# Patient Record
Sex: Female | Born: 1975 | Race: Black or African American | Hispanic: No | Marital: Single | Smoking: Current every day smoker
Health system: Southern US, Community
[De-identification: ages and names within clinical notes are randomized; demographics above are authoritative.]

## PROBLEM LIST (undated history)

## (undated) DIAGNOSIS — F329 Major depressive disorder, single episode, unspecified: Secondary | ICD-10-CM

## (undated) DIAGNOSIS — F32A Depression, unspecified: Secondary | ICD-10-CM

## (undated) DIAGNOSIS — I10 Essential (primary) hypertension: Secondary | ICD-10-CM

## (undated) DIAGNOSIS — F419 Anxiety disorder, unspecified: Secondary | ICD-10-CM

## (undated) HISTORY — PX: NO PAST SURGERIES: SHX2092

---

## 2000-02-25 ENCOUNTER — Emergency Department (HOSPITAL_COMMUNITY): Admission: EM | Admit: 2000-02-25 | Discharge: 2000-02-25 | Payer: Self-pay | Admitting: Emergency Medicine

## 2003-10-09 ENCOUNTER — Emergency Department (HOSPITAL_COMMUNITY): Admission: EM | Admit: 2003-10-09 | Discharge: 2003-10-09 | Payer: Self-pay | Admitting: Emergency Medicine

## 2006-08-03 ENCOUNTER — Emergency Department (HOSPITAL_COMMUNITY): Admission: EM | Admit: 2006-08-03 | Discharge: 2006-08-03 | Payer: Self-pay | Admitting: *Deleted

## 2007-03-31 ENCOUNTER — Emergency Department (HOSPITAL_COMMUNITY): Admission: EM | Admit: 2007-03-31 | Discharge: 2007-03-31 | Payer: Self-pay | Admitting: Emergency Medicine

## 2008-05-13 ENCOUNTER — Emergency Department (HOSPITAL_COMMUNITY): Admission: EM | Admit: 2008-05-13 | Discharge: 2008-05-13 | Payer: Self-pay | Admitting: Emergency Medicine

## 2008-12-03 ENCOUNTER — Emergency Department (HOSPITAL_COMMUNITY): Admission: EM | Admit: 2008-12-03 | Discharge: 2008-12-03 | Payer: Self-pay | Admitting: Emergency Medicine

## 2009-03-13 ENCOUNTER — Emergency Department (HOSPITAL_COMMUNITY): Admission: EM | Admit: 2009-03-13 | Discharge: 2009-03-13 | Payer: Self-pay | Admitting: Emergency Medicine

## 2009-10-16 ENCOUNTER — Emergency Department (HOSPITAL_BASED_OUTPATIENT_CLINIC_OR_DEPARTMENT_OTHER): Admission: EM | Admit: 2009-10-16 | Discharge: 2009-10-16 | Payer: Self-pay | Admitting: Emergency Medicine

## 2010-04-25 LAB — RAPID STREP SCREEN (MED CTR MEBANE ONLY): Streptococcus, Group A Screen (Direct): NEGATIVE

## 2010-11-09 ENCOUNTER — Emergency Department (HOSPITAL_COMMUNITY)
Admission: EM | Admit: 2010-11-09 | Discharge: 2010-11-09 | Disposition: A | Discharge status: Home / Self Care | Payer: Private Health Insurance - Indemnity | Attending: Emergency Medicine | Admitting: Emergency Medicine

## 2010-11-09 ENCOUNTER — Emergency Department (HOSPITAL_COMMUNITY): Payer: Private Health Insurance - Indemnity

## 2010-11-09 DIAGNOSIS — R071 Chest pain on breathing: Secondary | ICD-10-CM | POA: Insufficient documentation

## 2010-11-09 DIAGNOSIS — Z79899 Other long term (current) drug therapy: Secondary | ICD-10-CM | POA: Insufficient documentation

## 2010-11-09 DIAGNOSIS — R61 Generalized hyperhidrosis: Secondary | ICD-10-CM | POA: Insufficient documentation

## 2010-11-09 DIAGNOSIS — F17200 Nicotine dependence, unspecified, uncomplicated: Secondary | ICD-10-CM | POA: Insufficient documentation

## 2010-11-09 DIAGNOSIS — I10 Essential (primary) hypertension: Secondary | ICD-10-CM | POA: Insufficient documentation

## 2010-11-09 DIAGNOSIS — R209 Unspecified disturbances of skin sensation: Secondary | ICD-10-CM | POA: Insufficient documentation

## 2010-11-09 LAB — POCT I-STAT, CHEM 8
HCT: 44 % (ref 36.0–46.0)
Hemoglobin: 15 g/dL (ref 12.0–15.0)
Potassium: 3.8 mEq/L (ref 3.5–5.1)
Sodium: 137 mEq/L (ref 135–145)
TCO2: 22 mmol/L (ref 0–100)

## 2010-11-09 LAB — POCT I-STAT TROPONIN I

## 2011-07-22 ENCOUNTER — Emergency Department (HOSPITAL_COMMUNITY)
Admission: EM | Admit: 2011-07-22 | Discharge: 2011-07-22 | Disposition: A | Discharge status: Home / Self Care | Payer: Private Health Insurance - Indemnity | Source: Home / Self Care

## 2011-07-22 ENCOUNTER — Encounter (HOSPITAL_COMMUNITY): Payer: Self-pay | Admitting: *Deleted

## 2011-07-22 DIAGNOSIS — M545 Low back pain: Secondary | ICD-10-CM

## 2011-07-22 HISTORY — DX: Essential (primary) hypertension: I10

## 2011-07-22 LAB — POCT URINALYSIS DIP (DEVICE)
Glucose, UA: NEGATIVE mg/dL
Nitrite: NEGATIVE
Protein, ur: NEGATIVE mg/dL
Urobilinogen, UA: 0.2 mg/dL (ref 0.0–1.0)

## 2011-07-22 LAB — POCT PREGNANCY, URINE: Preg Test, Ur: NEGATIVE

## 2011-07-22 MED ORDER — TRAMADOL HCL 50 MG PO TABS: 50.0000 mg | ORAL_TABLET | Freq: Four times a day (QID) | ORAL | Status: AC | PRN

## 2011-07-22 MED ORDER — CYCLOBENZAPRINE HCL 10 MG PO TABS: 10.0000 mg | ORAL_TABLET | Freq: Three times a day (TID) | ORAL | Status: AC | PRN

## 2011-07-22 MED ORDER — MELOXICAM 15 MG PO TABS: 15.0000 mg | ORAL_TABLET | Freq: Every day | ORAL | Status: DC

## 2011-07-22 NOTE — ED Provider Notes (Signed)
Grace Nolan is a 36 y.o. female who presents to Urgent Care today for low back pain. Patient was at work yesterday when she noted sudden onset of mild low back pain. She denies any injury or change in usual level of activity preceding the pain.  The pain worsened in the evening and she took Tylenol which did not help much. This morning the pain has worsened again. The pain does not radiate and stays in the low back. She denies any weakness numbness bowel or bladder dysfunction or trouble walking.  Standing up makes the pain worse.     PMH reviewed. Significant for hypertension History  Substance Use Topics  . Smoking status: Current Everyday Smoker  . Smokeless tobacco: Not on file  . Alcohol Use: Yes   ROS as above Medications reviewed. No current facility-administered medications for this encounter.   Current Outpatient Prescriptions  Medication Sig Dispense Refill  . ALPRAZolam (XANAX) 0.25 MG tablet Take 0.25 mg by mouth at bedtime as needed.      Marland Kitchen lisinopril (PRINIVIL,ZESTRIL) 20 MG tablet Take 20 mg by mouth daily.      . cyclobenzaprine (FLEXERIL) 10 MG tablet Take 1 tablet (10 mg total) by mouth 3 (three) times daily as needed for muscle spasms.  30 tablet  0  . meloxicam (MOBIC) 15 MG tablet Take 1 tablet (15 mg total) by mouth daily.  14 tablet  0  . traMADol (ULTRAM) 50 MG tablet Take 1 tablet (50 mg total) by mouth every 6 (six) hours as needed for pain.  10 tablet  0    Exam:  BP 115/76  Pulse 80  Temp(Src) 99 F (37.2 C) (Oral)  Resp 20  SpO2 98%  LMP 07/20/2011 Gen: Well NAD HEENT: EOMI,  MMM Lungs: CTABL Nl WOB Heart: RRR no MRG Abd: NABS, NT, ND, no costovertebral angle tenderness Exts: Non edematous BL  LE, warm and well perfused.  MSK: Nontender over spinal midline. Tender to palpation bilateral SI joints. Negative straight leg raise test bilaterally.  Reflexes are diminished but equal bilaterally. Strength and sensation are intact. Patient HEENT get  onto and off of the exam table by herself.  Results for orders placed during the hospital encounter of 07/22/11 (from the past 24 hour(s))  POCT URINALYSIS DIP (DEVICE)     Status: Abnormal   Collection Time   07/22/11  5:15 PM      Component Value Range   Glucose, UA NEGATIVE  NEGATIVE (mg/dL)   Bilirubin Urine NEGATIVE  NEGATIVE    Ketones, ur NEGATIVE  NEGATIVE (mg/dL)   Specific Gravity, Urine 1.015  1.005 - 1.030    Hgb urine dipstick MODERATE (*) NEGATIVE    pH 6.0  5.0 - 8.0    Protein, ur NEGATIVE  NEGATIVE (mg/dL)   Urobilinogen, UA 0.2  0.0 - 1.0 (mg/dL)   Nitrite NEGATIVE  NEGATIVE    Leukocytes, UA TRACE (*) NEGATIVE   POCT PREGNANCY, URINE     Status: Normal   Collection Time   07/22/11  5:19 PM      Component Value Range   Preg Test, Ur NEGATIVE  NEGATIVE    No results found.  Assessment and Plan: 36 y.o. female with lumbar back pain.  The pain started at work without apparent injury.  This is likely a muscle or tendon pull.  She has no red flag signs or symptom. Plan to treat symptomatically with meloxicam, Flexeril, and 10 tablets of tramadol.  Provided a work  note, and advised consideration of workers compensation claim.  Recommended followup with primary care doctor. Discussed warning signs or symptoms. Please see discharge instructions. Patient expresses understanding.      Rodolph Bong, MD 07/22/11 [phone]

## 2011-07-22 NOTE — Discharge Instructions (Signed)
Thank you for coming in today. Please take the meloxicam for up to 2 weeks as needed.  Use the flexeril at night for pain.  Tramadol and Flexeril will make you sleepy. Don't drive after taking those meds.  Come back or go to the emergency room if you notice new weakness new numbness problems walking or bowel or bladder problems. Follow up with your doctor if you do not get better.   Back Pain, Adult Low back pain is very common. About 1 in 5 people have back pain.The cause of low back pain is rarely dangerous. The pain often gets better over time.About half of people with a sudden onset of back pain feel better in just 2 weeks. About 8 in 10 people feel better by 6 weeks.  CAUSES Some common causes of back pain include:  Strain of the muscles or ligaments supporting the spine.   Wear and tear (degeneration) of the spinal discs.   Arthritis.   Direct injury to the back.  DIAGNOSIS Most of the time, the direct cause of low back pain is not known.However, back pain can be treated effectively even when the exact cause of the pain is unknown.Answering your caregiver's questions about your overall health and symptoms is one of the most accurate ways to make sure the cause of your pain is not dangerous. If your caregiver needs more information, he or she may order lab work or imaging tests (X-rays or MRIs).However, even if imaging tests show changes in your back, this usually does not require surgery. HOME CARE INSTRUCTIONS For many people, back pain returns.Since low back pain is rarely dangerous, it is often a condition that people can learn to [facility] their own.   Remain active. It is stressful on the back to sit or stand in one place. Do not sit, drive, or stand in one place for more than 30 minutes at a time. Take short walks on level surfaces as soon as pain allows.Try to increase the length of time you walk each day.   Do not stay in bed.Resting more than 1 or 2 days can delay  your recovery.   Do not avoid exercise or work.Your body is made to move.It is not dangerous to be active, even though your back may hurt.Your back will likely heal faster if you return to being active before your pain is gone.   Pay attention to your body when you bend and lift. Many people have less discomfortwhen lifting if they bend their knees, keep the load close to their bodies,and avoid twisting. Often, the most comfortable positions are those that put less stress on your recovering back.   Find a comfortable position to sleep. Use a firm mattress and lie on your side with your knees slightly bent. If you lie on your back, put a pillow under your knees.   Only take over-the-counter or prescription medicines as directed by your caregiver. Over-the-counter medicines to reduce pain and inflammation are often the most helpful.Your caregiver may prescribe muscle relaxant drugs.These medicines help dull your pain so you can more quickly return to your normal activities and healthy exercise.   Put ice on the injured area.   Put ice in a plastic bag.   Place a towel between your skin and the bag.   Leave the ice on for 15 to 20 minutes, 3 to 4 times a day for the first 2 to 3 days. After that, ice and heat may be alternated to reduce pain and  spasms.   Ask your caregiver about trying back exercises and gentle massage. This may be of some benefit.   Avoid feeling anxious or stressed.Stress increases muscle tension and can worsen back pain.It is important to recognize when you are anxious or stressed and learn ways to manage it.Exercise is a great option.  SEEK MEDICAL CARE IF:  You have pain that is not relieved with rest or medicine.   You have pain that does not improve in 1 week.   You have new symptoms.   You are generally not feeling well.  SEEK IMMEDIATE MEDICAL CARE IF:   You have pain that radiates from your back into your legs.   You develop new bowel or bladder  control problems.   You have unusual weakness or numbness in your arms or legs.   You develop nausea or vomiting.   You develop abdominal pain.   You feel faint.  Document Released: 01/27/2005 Document Revised: 01/16/2011 Document Reviewed: 06/17/2010 [facility] Patient Information 2012 [place], [place].

## 2011-07-22 NOTE — ED Provider Notes (Signed)
Medical screening examination/treatment/procedure(s) were performed by a resident physician and as supervising physician I was immediately available for consultation/collaboration.  Leslee Home, M.D.   Reuben Likes, MD 07/22/11 2129

## 2011-07-22 NOTE — ED Notes (Signed)
pT  REPORTS  PAIN  IN  THE  MIDDLE  OF  HER  LOWER  BACK   WHICH  SHE  REPORTS     BEGAN  YEST  PT  ALOS  REPORTS  SOME  PRESSURE  WHEN  SHE  URINATES    AS  WELL  AS  FREQUENCY  -  SHE  AMBULATES  WITH A  SLOW  STEADY  GAIT  SOMEWENT  BENT  OVER

## 2011-12-03 ENCOUNTER — Ambulatory Visit (HOSPITAL_COMMUNITY)
Admission: RE | Admit: 2011-12-03 | Payer: Managed Care, Other (non HMO) | Source: Home / Self Care | Admitting: Psychiatry

## 2011-12-04 ENCOUNTER — Emergency Department (HOSPITAL_COMMUNITY)
Admission: EM | Admit: 2011-12-04 | Discharge: 2011-12-05 | Disposition: A | Discharge status: Home / Self Care | Payer: Managed Care, Other (non HMO) | Attending: Emergency Medicine | Admitting: Emergency Medicine

## 2011-12-04 ENCOUNTER — Encounter (HOSPITAL_COMMUNITY): Payer: Self-pay

## 2011-12-04 DIAGNOSIS — I10 Essential (primary) hypertension: Secondary | ICD-10-CM | POA: Insufficient documentation

## 2011-12-04 DIAGNOSIS — F3289 Other specified depressive episodes: Secondary | ICD-10-CM | POA: Insufficient documentation

## 2011-12-04 DIAGNOSIS — Z87891 Personal history of nicotine dependence: Secondary | ICD-10-CM | POA: Insufficient documentation

## 2011-12-04 DIAGNOSIS — F411 Generalized anxiety disorder: Secondary | ICD-10-CM | POA: Insufficient documentation

## 2011-12-04 DIAGNOSIS — F1210 Cannabis abuse, uncomplicated: Secondary | ICD-10-CM | POA: Insufficient documentation

## 2011-12-04 DIAGNOSIS — F1410 Cocaine abuse, uncomplicated: Secondary | ICD-10-CM | POA: Insufficient documentation

## 2011-12-04 DIAGNOSIS — F1910 Other psychoactive substance abuse, uncomplicated: Secondary | ICD-10-CM

## 2011-12-04 DIAGNOSIS — F329 Major depressive disorder, single episode, unspecified: Secondary | ICD-10-CM

## 2011-12-04 HISTORY — DX: Major depressive disorder, single episode, unspecified: F32.9

## 2011-12-04 HISTORY — DX: Depression, unspecified: F32.A

## 2011-12-04 LAB — CBC WITH DIFFERENTIAL/PLATELET
Basophils Absolute: 0 10*3/uL (ref 0.0–0.1)
Basophils Relative: 0 % (ref 0–1)
Eosinophils Relative: 0 % (ref 0–5)
Lymphocytes Relative: 35 % (ref 12–46)
MCHC: 33.9 g/dL (ref 30.0–36.0)
MCV: 94.6 fL (ref 78.0–100.0)
Monocytes Absolute: 1 10*3/uL (ref 0.1–1.0)
Platelets: 276 10*3/uL (ref 150–400)
RDW: 14.2 % (ref 11.5–15.5)
WBC: 14.2 10*3/uL — ABNORMAL HIGH (ref 4.0–10.5)

## 2011-12-04 LAB — RAPID URINE DRUG SCREEN, HOSP PERFORMED
Amphetamines: NOT DETECTED
Barbiturates: NOT DETECTED
Benzodiazepines: NOT DETECTED
Cocaine: NOT DETECTED
Tetrahydrocannabinol: NOT DETECTED

## 2011-12-04 LAB — COMPREHENSIVE METABOLIC PANEL
ALT: 20 U/L (ref 0–35)
AST: 23 U/L (ref 0–37)
Albumin: 3.8 g/dL (ref 3.5–5.2)
CO2: 22 mEq/L (ref 19–32)
Calcium: 9.5 mg/dL (ref 8.4–10.5)
Creatinine, Ser: 0.84 mg/dL (ref 0.50–1.10)
GFR calc non Af Amer: 88 mL/min — ABNORMAL LOW (ref >90)
Sodium: 133 mEq/L — ABNORMAL LOW (ref 135–145)
Total Protein: 8.1 g/dL (ref 6.0–8.3)

## 2011-12-04 LAB — ETHANOL: Alcohol, Ethyl (B): 159 mg/dL — ABNORMAL HIGH (ref 0–11)

## 2011-12-04 MED ORDER — LISINOPRIL-HYDROCHLOROTHIAZIDE 20-12.5 MG PO TABS: 1.0000 | ORAL_TABLET | Freq: Every day | ORAL | Status: DC

## 2011-12-04 MED ORDER — ONDANSETRON HCL 4 MG PO TABS: 4.0000 mg | ORAL_TABLET | Freq: Three times a day (TID) | ORAL | Status: DC | PRN

## 2011-12-04 MED ORDER — LORAZEPAM 1 MG PO TABS
0.0000 mg | ORAL_TABLET | Freq: Four times a day (QID) | ORAL | Status: DC
  Administered 2011-12-04: 1 mg via ORAL
  Filled 2011-12-04: qty 1

## 2011-12-04 MED ORDER — ADULT MULTIVITAMIN W/MINERALS CH
1.0000 | ORAL_TABLET | Freq: Every day | ORAL | Status: DC
  Administered 2011-12-04: 1 via ORAL
  Filled 2011-12-04: qty 1

## 2011-12-04 MED ORDER — THIAMINE HCL 100 MG/ML IJ SOLN: 100.0000 mg | Freq: Every day | INTRAMUSCULAR | Status: DC

## 2011-12-04 MED ORDER — FOLIC ACID 1 MG PO TABS
1.0000 mg | ORAL_TABLET | Freq: Every day | ORAL | Status: DC
  Administered 2011-12-04: 1 mg via ORAL
  Filled 2011-12-04: qty 1

## 2011-12-04 MED ORDER — IBUPROFEN 200 MG PO TABS: 600.0000 mg | ORAL_TABLET | Freq: Three times a day (TID) | ORAL | Status: DC | PRN

## 2011-12-04 MED ORDER — LISINOPRIL 20 MG PO TABS: 20.0000 mg | ORAL_TABLET | Freq: Every day | ORAL | Status: DC

## 2011-12-04 MED ORDER — VITAMIN B-1 100 MG PO TABS
100.0000 mg | ORAL_TABLET | Freq: Every day | ORAL | Status: DC
  Administered 2011-12-04: 100 mg via ORAL
  Filled 2011-12-04: qty 1

## 2011-12-04 MED ORDER — LORAZEPAM 1 MG PO TABS: 0.0000 mg | ORAL_TABLET | Freq: Two times a day (BID) | ORAL | Status: DC

## 2011-12-04 MED ORDER — ALUM & MAG HYDROXIDE-SIMETH 200-200-20 MG/5ML PO SUSP: 30.0000 mL | ORAL | Status: DC | PRN

## 2011-12-04 MED ORDER — ACETAMINOPHEN 325 MG PO TABS: 650.0000 mg | ORAL_TABLET | ORAL | Status: DC | PRN

## 2011-12-04 MED ORDER — LORAZEPAM 1 MG PO TABS: 1.0000 mg | ORAL_TABLET | Freq: Three times a day (TID) | ORAL | Status: DC | PRN

## 2011-12-04 MED ORDER — HYDROCHLOROTHIAZIDE 12.5 MG PO CAPS: 12.5000 mg | ORAL_CAPSULE | Freq: Every day | ORAL | Status: DC

## 2011-12-04 MED ORDER — ZOLPIDEM TARTRATE 5 MG PO TABS: 5.0000 mg | ORAL_TABLET | Freq: Every evening | ORAL | Status: DC | PRN

## 2011-12-04 NOTE — ED Provider Notes (Signed)
History     CSN: [id number]  Arrival date & time 12/04/11  1903   First MD Initiated Contact with Patient 12/04/11 1912      Chief Complaint  Patient presents with  . Medical Clearance    (Consider location/radiation/quality/duration/timing/severity/associated sxs/prior treatment) The history is provided by the patient.   patient presents with worsening depression. She states she's been depressed for a while, but it stopped working due to it recently. She states that she needs help. She states she feels as if she's not doing anything up to her potential. No active suicidal thoughts but has thought about not being around. She states she's been drinking more heavily. She drinks about 12 pack of beer a couple shots a day. She states she's had more time to drinks and she has not been working. She will occasionally use marijuana or cocaine, but has not used cocaine recently.   Past Medical History  Diagnosis Date  . Hypertension     History reviewed. No pertinent past surgical history.  History reviewed. No pertinent family history.  History  Substance Use Topics  . Smoking status: Current Every Day Smoker  . Smokeless tobacco: Never Used  . Alcohol Use: Yes     Patient states she drinks daily- 12 pack beer and a couple shots of liquor    OB History    Grav Para Term Preterm Abortions TAB SAB Ect Mult Living                  Review of Systems  Constitutional: Negative for activity change and appetite change.  HENT: Negative for neck stiffness.   Eyes: Negative for pain.  Respiratory: Negative for chest tightness and shortness of breath.   Cardiovascular: Negative for chest pain and leg swelling.  Gastrointestinal: Negative for nausea, vomiting, abdominal pain and diarrhea.  Genitourinary: Negative for flank pain.  Musculoskeletal: Negative for back pain.  Skin: Negative for rash.  Neurological: Negative for weakness, numbness and headaches.  Psychiatric/Behavioral:  Positive for dysphoric mood. Negative for behavioral problems.    Allergies  Review of patient's allergies indicates no known allergies.  Home Medications   Current Outpatient Rx  Name Route Sig Dispense Refill  . ALPRAZOLAM 0.25 MG PO TABS Oral Take 0.25 mg by mouth at bedtime as needed. Anxiety    . LISINOPRIL-HYDROCHLOROTHIAZIDE 20-12.5 MG PO TABS Oral Take 1 tablet by mouth daily.      BP 117/73  Pulse 79  Temp 98.1 F (36.7 C) (Oral)  Resp 18  SpO2 99%  LMP 11/23/2011  Physical Exam  Nursing note and vitals reviewed. Constitutional: She is oriented to person, place, and time. She appears well-developed and well-nourished.  HENT:  Head: Normocephalic and atraumatic.  Eyes: EOM are normal. Pupils are equal, round, and reactive to light.  Neck: Normal range of motion. Neck supple.  Cardiovascular: Normal rate, regular rhythm and normal heart sounds.   No murmur heard. Pulmonary/Chest: Effort normal and breath sounds normal. No respiratory distress. She has no wheezes. She has no rales.  Abdominal: Soft. Bowel sounds are normal. She exhibits no distension. There is no tenderness. There is no rebound and no guarding.  Musculoskeletal: Normal range of motion.  Neurological: She is alert and oriented to person, place, and time. No cranial nerve deficit.  Skin: Skin is warm and dry.  Psychiatric: Her speech is normal.       Patient appears depressed and somewhat tearful.    ED Course  Procedures (including critical  care time)  Labs Reviewed  CBC WITH DIFFERENTIAL - Abnormal; Notable for the following:    WBC 14.2 (*)     Neutro Abs 8.2 (*)     Lymphs Abs 5.0 (*)     All other components within normal limits  COMPREHENSIVE METABOLIC PANEL - Abnormal; Notable for the following:    Sodium 133 (*)     Potassium 3.4 (*)     Glucose, Bld 117 (*)     Total Bilirubin 0.2 (*)     GFR calc non Af Amer 88 (*)     All other components within normal limits  ETHANOL - Abnormal;  Notable for the following:    Alcohol, Ethyl (B) 159 (*)     All other components within normal limits  URINE RAPID DRUG SCREEN (HOSP PERFORMED)  PREGNANCY, URINE   No results found.   1. Substance abuse   2. Depression       MDM  Patient with depression and somewhat alcohol abuse. Lab work is overall reassuring. Patient is then seen by ACT team and will be discharged. She'll be given some benzodiazepine to followup as needed. She does not appear to be actively suicidal.        Juliet Rude. Rubin Payor, MD 12/04/11 2332

## 2011-12-04 NOTE — ED Notes (Signed)
Patient reports that she has been out of work  Since 10/31/11 and has been feeling depressed. Patient states she has had a couple shots of liquor today. Patient states she is seeking help for depression. Patient denies SI/HI

## 2011-12-05 ENCOUNTER — Encounter (HOSPITAL_COMMUNITY): Payer: Self-pay | Admitting: *Deleted

## 2011-12-05 NOTE — BH Assessment (Signed)
Assessment Note   Grace Nolan is a 36 y.o. female who presents to [facility] with increased depression and anxiety.  Pt denies SI/HI/Psych, no intent to harm self.  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  Pt says she's stressed from work(on leave from work since 10/23/11), recent legal trouble involving son, financial issues and lost apartment(currently living with grandparents).  Pt admits to drinking 12 pk of beer 6 out of 7 days or 1/2 pint of liquor.  Pt has no inpt/outpt mental health hx.  Pt contracted for safety and recv'd outpt referrals, dr. Rubin Payor agreed with disposition and d/c pt home.    Axis I: Depressive Disorder NOS Axis II: Deferred Axis III:  Past Medical History  Diagnosis Date  . Hypertension   . Depression    Axis IV: economic problems, housing problems, other psychosocial or environmental problems, problems related to social environment and problems with access to health care services Axis V: 51-60 moderate symptoms  Past Medical History:  Past Medical History  Diagnosis Date  . Hypertension   . Depression     History reviewed. No pertinent past surgical history.  Family History: History reviewed. No pertinent family history.  Social History:  reports that she has been smoking.  She has never used smokeless tobacco. She reports that she drinks alcohol. She reports that she uses illicit drugs (Marijuana and Cocaine).  Additional Social History:  Alcohol / Drug Use Pain Medications: None  Prescriptions: None  Over the Counter: None  History of alcohol / drug use?: Yes Longest period of sobriety (when/how long): None   CIWA: CIWA-Ar BP: 117/73 mmHg Pulse Rate: 79  Nausea and Vomiting: no nausea and no vomiting Tactile Disturbances: none Tremor: no tremor Auditory Disturbances: not present Paroxysmal  Sweats: no sweat visible Visual Disturbances: not present Anxiety: mildly anxious Headache, Fullness in Head: very mild Agitation: normal activity Orientation and Clouding of Sensorium: oriented and can do serial additions CIWA-Ar Total: 2  COWS:    Allergies: No Known Allergies  Home Medications:  (Not in a hospital admission)  OB/GYN Status:  Patient's last menstrual period was 11/23/2011.  General Assessment Data Location of Assessment: [facility] Living Arrangements: Other relatives (Lives with grandparents ) Can pt return to current living arrangement?: Yes Admission Status: Voluntary Is patient capable of signing voluntary admission?: Yes Transfer from: Acute Hospital Referral Source: MD  Education Status Is patient currently in school?: No Current Grade: None  Highest grade of school patient has completed: None  Name of school: None  Contact person: None   Risk to self Suicidal Ideation: No Suicidal Intent: No Is patient at risk for suicide?: No Suicidal Plan?: No Access to Means: No What has been your use of drugs/alcohol within the last 12 months?: Abusing alcohol for approx 1 month  Previous Attempts/Gestures: No How many times?: 0  Other Self Harm Risks: None Triggers for Past Attempts: None known Intentional Self Injurious Behavior: None Family Suicide History: No Recent stressful life event(s): Financial Problems;Other (Comment) (Increased depression ) Persecutory voices/beliefs?: No Depression: Yes Depression Symptoms: Feeling worthless/self pity;Loss of interest in usual pleasures;Tearfulness;Insomnia;Despondent;Isolating Substance abuse history and/or treatment for substance abuse?: No Suicide prevention information given to non-admitted patients: Not applicable  Risk to Others Homicidal Ideation: No Thoughts of Harm to Others: No Current Homicidal Intent: No Current Homicidal Plan: No Access to Homicidal Means: No Identified Victim:  None  History  of harm to others?: No Assessment of Violence: None Noted Violent Behavior Description: None  Does patient have access to weapons?: No Criminal Charges Pending?: No Describe Pending Criminal Charges: None  Does patient have a court date: No  Psychosis Hallucinations: None noted Delusions: None noted  Mental Status Report Appear/Hygiene: Other (Comment) (Appropriate ) Eye Contact: Good Motor Activity: Unremarkable Speech: Logical/coherent Level of Consciousness: Alert Mood: Depressed;Sad Affect: Depressed;Sad Anxiety Level: None Thought Processes: Coherent;Relevant Judgement: Unimpaired Orientation: Person;Place;Time;Situation Obsessive Compulsive Thoughts/Behaviors: None  Cognitive Functioning Concentration: Decreased Memory: Recent Intact;Remote Intact IQ: Average Insight: Good Impulse Control: Good Appetite: Fair Weight Loss: 0  Weight Gain: 0  Sleep: Decreased Total Hours of Sleep: 2  Vegetative Symptoms: None  ADLScreening [facility]) Patient's cognitive ability adequate to safely complete daily activities?: Yes Patient able to express need for assistance with ADLs?: Yes Independently performs ADLs?: Yes (appropriate for developmental age)  Abuse/Neglect [facility]) Physical Abuse: Denies Verbal Abuse: Denies Sexual Abuse: Denies  Prior Inpatient Therapy Prior Inpatient Therapy: No Prior Therapy Dates: None  Prior Therapy Facilty/Provider(s): None  Reason for Treatment: None   Prior Outpatient Therapy Prior Outpatient Therapy: No Prior Therapy Dates: None  Prior Therapy Facilty/Provider(s): None  Reason for Treatment: None   ADL Screening (condition at time of admission) Patient's cognitive ability adequate to safely complete daily activities?: Yes Patient able to express need for assistance with ADLs?: Yes Independently performs ADLs?: Yes (appropriate for developmental age) Weakness of Legs: None Weakness of Arms/Hands: None  Home  Assistive Devices/Equipment Home Assistive Devices/Equipment: None  Therapy Consults (therapy consults require a physician order) PT Evaluation Needed: No OT Evalulation Needed: No SLP Evaluation Needed: No Abuse/Neglect Assessment (Assessment to be complete while patient is alone) Physical Abuse: Denies Verbal Abuse: Denies Sexual Abuse: Denies Exploitation of patient/patient's resources: Denies Self-Neglect: Denies Values / Beliefs Cultural Requests During Hospitalization: None Spiritual Requests During Hospitalization: None Consults Spiritual Care Consult Needed: No Social Work Consult Needed: No Merchant navy officer (For Healthcare) Advance Directive: Patient does not have advance directive;Patient would not like information Pre-existing out of facility DNR order (yellow form or pink MOST form): No Nutrition Screen- [facility] Patient's home diet: Regular Have you recently lost weight without trying?: No Have you been eating poorly because of a decreased appetite?: No Malnutrition Screening Tool Score: 0   Additional Information 1:1 In Past 12 Months?: No CIRT Risk: No Elopement Risk: No Does patient have medical clearance?: Yes     Disposition:  Disposition Disposition of Patient: Outpatient treatment;Referred to (Mental health and local community therapists/psych) Type of outpatient treatment: Adult  On Site Evaluation by:   Reviewed with Physician:     Murrell Redden 12/05/2011 12:55 AM

## 2012-02-13 ENCOUNTER — Encounter (HOSPITAL_COMMUNITY): Payer: Self-pay | Admitting: Emergency Medicine

## 2012-02-13 ENCOUNTER — Emergency Department (HOSPITAL_COMMUNITY)
Admission: EM | Admit: 2012-02-13 | Discharge: 2012-02-13 | Disposition: A | Discharge status: Home / Self Care | Payer: Managed Care, Other (non HMO) | Source: Home / Self Care | Attending: Emergency Medicine | Admitting: Emergency Medicine

## 2012-02-13 DIAGNOSIS — J069 Acute upper respiratory infection, unspecified: Secondary | ICD-10-CM

## 2012-02-13 LAB — POCT RAPID STREP A: Streptococcus, Group A Screen (Direct): NEGATIVE

## 2012-02-13 MED ORDER — PHENYLEPHRINE-CHLORPHEN-DM 10-4-12.5 MG/5ML PO LIQD: 5.0000 mL | ORAL | Status: DC | PRN

## 2012-02-13 NOTE — ED Provider Notes (Signed)
History     CSN: [id number]  Arrival date & time 02/13/12  1041   First MD Initiated Contact with Patient 02/13/12 1156      Chief Complaint  Patient presents with  . URI    (Consider location/radiation/quality/duration/timing/severity/associated sxs/prior treatment) HPI Comments: 37-year-old female presents with one week of cough, chest pain associated with cough, sore throat, runny nose and congested nose, bodyaches and malaise. She denies fever, chills or abdominal pain or GI/GU symptoms.   Past Medical History  Diagnosis Date  . Hypertension   . Depression     No past surgical history on file.  No family history on file.  History  Substance Use Topics  . Smoking status: Current Every Day Smoker  . Smokeless tobacco: Never Used  . Alcohol Use: Yes     Comment: Patient states she drinks daily- 12 pack beer and a couple shots of liquor    OB History    Grav Para Term Preterm Abortions TAB SAB Ect Mult Living                  Review of Systems  Constitutional: Negative for fever, chills, activity change, appetite change and fatigue.  HENT: Positive for congestion, rhinorrhea and postnasal drip. Negative for facial swelling, neck pain and neck stiffness.   Eyes: Negative.   Respiratory: Positive for cough. Negative for shortness of breath, wheezing and stridor.   Cardiovascular: Negative.   Gastrointestinal: Negative.   Skin: Negative for pallor and rash.  Neurological: Negative.     Allergies  Review of patient's allergies indicates no known allergies.  Home Medications   Current Outpatient Rx  Name  Route  Sig  Dispense  Refill  . ALPRAZOLAM 0.25 MG PO TABS   Oral   Take 0.25 mg by mouth at bedtime as needed. Anxiety         . LISINOPRIL-HYDROCHLOROTHIAZIDE 20-12.5 MG PO TABS   Oral   Take 1 tablet by mouth daily.         Marland Kitchen LORAZEPAM 1 MG PO TABS   Oral   Take 1 tablet (1 mg total) by mouth 3 (three) times daily as needed for anxiety.   15  tablet   0   . PHENYLEPHRINE-CHLORPHEN-DM 11-14-10.5 MG/5ML PO LIQD   Oral   Take 5 mLs by mouth every 4 (four) hours as needed.   120 mL   0     BP 154/104  Pulse 71  Temp 99.4 F (37.4 C) (Oral)  Resp 18  SpO2 97%  LMP 01/23/2012  Physical Exam  Nursing note and vitals reviewed. Constitutional: She is oriented to person, place, and time. She appears well-developed and well-nourished. No distress.  HENT:  Head: Normocephalic and atraumatic.  Mouth/Throat: No oropharyngeal exudate.       Bilateral TMs are normal Pharynx with minor erythema, no exudates and positive for clear PND  Eyes: EOM are normal. Pupils are equal, round, and reactive to light.  Neck: Normal range of motion. Neck supple.  Cardiovascular: Normal rate, regular rhythm and normal heart sounds.   Pulmonary/Chest: Effort normal and breath sounds normal. No respiratory distress. She has no wheezes. She has no rales.  Abdominal: Soft. There is no tenderness.  Musculoskeletal: Normal range of motion. She exhibits no edema.  Lymphadenopathy:    She has no cervical adenopathy.  Neurological: She is alert and oriented to person, place, and time. No cranial nerve deficit.  Skin: Skin is warm and dry. No rash  noted.  Psychiatric: She has a normal mood and affect.    ED Course  Procedures (including critical care time)   Labs Reviewed  POCT RAPID STREP A ([facility]RG CARE ONLY)   No results found.   1. URI (upper respiratory infection)       MDM  Patient is discharged in stable condition she has minor URI symptoms. He may go back to work on Monday. In the meantime she should drink plenty of fluids and stay well-hydrated Norell CS 1 teaspoon every 4 hours when necessary cough and congestion Wash hands frequently She has history of hypertension and is treated with Zestoretic. She has not had her medication today she advised to take when she gets home and daily. She we will need followup with her physician to  have her blood pressure rechecked x-rays down to normal.         Hayden Rasmussen, NP 02/13/12 1224  Hayden Rasmussen, NP 02/13/12 1225

## 2012-02-13 NOTE — ED Notes (Signed)
Pt c/o cold sx x1 week.  Sx include: cough w/green sputum, chest discomfort, runny nose, fevers, body aches, diarrhea, sore throat Denies: nauseas, vomiting Sx getting worse x1 day She is alert w/no signs of acute distress.

## 2012-02-17 NOTE — ED Provider Notes (Signed)
Medical screening examination/treatment/procedure(s) were performed by resident physician or non-physician practitioner and as supervising physician I was immediately available for consultation/collaboration.   Jane Birkel DOUGLAS MD.    Aneira Cavitt D Aileana Hodder, MD 02/17/12 1356 

## 2012-07-29 ENCOUNTER — Encounter (HOSPITAL_COMMUNITY): Payer: Self-pay | Admitting: Family Medicine

## 2012-07-29 ENCOUNTER — Emergency Department (HOSPITAL_COMMUNITY)
Admission: EM | Admit: 2012-07-29 | Discharge: 2012-07-30 | Disposition: A | Discharge status: Home / Self Care | Payer: Managed Care, Other (non HMO) | Attending: Emergency Medicine | Admitting: Emergency Medicine

## 2012-07-29 DIAGNOSIS — R52 Pain, unspecified: Secondary | ICD-10-CM | POA: Insufficient documentation

## 2012-07-29 DIAGNOSIS — F411 Generalized anxiety disorder: Secondary | ICD-10-CM | POA: Insufficient documentation

## 2012-07-29 DIAGNOSIS — F329 Major depressive disorder, single episode, unspecified: Secondary | ICD-10-CM | POA: Insufficient documentation

## 2012-07-29 DIAGNOSIS — R002 Palpitations: Secondary | ICD-10-CM | POA: Insufficient documentation

## 2012-07-29 DIAGNOSIS — F3289 Other specified depressive episodes: Secondary | ICD-10-CM | POA: Insufficient documentation

## 2012-07-29 DIAGNOSIS — R209 Unspecified disturbances of skin sensation: Secondary | ICD-10-CM | POA: Insufficient documentation

## 2012-07-29 DIAGNOSIS — I10 Essential (primary) hypertension: Secondary | ICD-10-CM | POA: Insufficient documentation

## 2012-07-29 DIAGNOSIS — R202 Paresthesia of skin: Secondary | ICD-10-CM

## 2012-07-29 DIAGNOSIS — Z79899 Other long term (current) drug therapy: Secondary | ICD-10-CM | POA: Insufficient documentation

## 2012-07-29 DIAGNOSIS — F17200 Nicotine dependence, unspecified, uncomplicated: Secondary | ICD-10-CM | POA: Insufficient documentation

## 2012-07-29 DIAGNOSIS — R51 Headache: Secondary | ICD-10-CM | POA: Insufficient documentation

## 2012-07-29 DIAGNOSIS — R7309 Other abnormal glucose: Secondary | ICD-10-CM | POA: Insufficient documentation

## 2012-07-29 DIAGNOSIS — R739 Hyperglycemia, unspecified: Secondary | ICD-10-CM

## 2012-07-29 HISTORY — DX: Anxiety disorder, unspecified: F41.9

## 2012-07-29 NOTE — ED Notes (Addendum)
Patient states she has had a headache, tingling in her feet and hands since 8pm. Also reports shortness of breath. Describes headache as pressure.

## 2012-07-30 LAB — POCT I-STAT, CHEM 8
BUN: 6 mg/dL (ref 6–23)
Chloride: 104 mEq/L (ref 96–112)
HCT: 44 % (ref 36.0–46.0)
Sodium: 137 mEq/L (ref 135–145)
TCO2: 21 mmol/L (ref 0–100)

## 2012-07-30 NOTE — ED Notes (Signed)
Patient states she began having a headache around 2000, described as tingling. Patient rating pain 6/10 on the top of her head and in her temples. Patient A&Ox4, NAD at this time. Patient watching TV.

## 2012-07-30 NOTE — ED Provider Notes (Signed)
History     CSN: [id number]  Arrival date & time 07/29/12  2345   First MD Initiated Contact with Patient 07/30/12 0015      Chief Complaint  Patient presents with  . Headache  . Tingling   HPI  History provided by the patient and family. Patient is a 37 year old female history of hypertension, anxiety and depression who presents with multiple complaints of heart palpitations, numbness and tingling to fingers and toes, head pressure and general bodyache.  Symptoms first began as she was lying down to go to sleep with increased heart rate and palpitations. Patient then reports feeling tingling and numbness in her bilateral fingertips and toes. She was feeling short of breath with rapid breathing. Because she was not feeling well she had her family bring her to the emergency room. On the way she began having a tightness and pressure in her head. This isn't described as mild and not as painful. She also complains of tightness through her body with slight ache. She did not use any treatments for her symptoms. She does take Xanax at times for anxiety. She did not use any of this tonight. She does report having some stress and worry before going to sleep. She was not tearful. Denies any other aggravating or alleviating factors. Denies any other associated symptoms.    Past Medical History  Diagnosis Date  . Hypertension   . Depression   . Anxiety     No past surgical history on file.  No family history on file.  History  Substance Use Topics  . Smoking status: Current Every Day Smoker -- 0.50 packs/day    Types: Cigarettes  . Smokeless tobacco: Never Used  . Alcohol Use: Yes     Comment: Patient states she drinks daily- 12 pack beer and a couple shots of liquor    OB History   Grav Para Term Preterm Abortions TAB SAB Ect Mult Living                  Review of Systems  Constitutional: Negative for fever, chills and diaphoresis.  Neurological: Negative for weakness and  numbness.  All other systems reviewed and are negative.    Allergies  Review of patient's allergies indicates no known allergies.  Home Medications   Current Outpatient Rx  Name  Route  Sig  Dispense  Refill  . ALPRAZolam (XANAX) 0.25 MG tablet   Oral   Take 0.25 mg by mouth at bedtime as needed. Anxiety         . citalopram (CELEXA) 10 MG tablet   Oral   Take 10 mg by mouth daily.         Marland Kitchen lisinopril-hydrochlorothiazide (PRINZIDE,ZESTORETIC) 20-12.5 MG per tablet   Oral   Take 1 tablet by mouth daily.           BP 152/93  Pulse 101  Temp(Src) 99.8 F (37.7 C) (Oral)  Resp 20  Ht 5\' 11"  (1.803 m)  Wt 240 lb (108.863 kg)  BMI 33.49 kg/m2  SpO2 100%  LMP 06/30/2012  Physical Exam  Nursing note and vitals reviewed. Constitutional: She is oriented to person, place, and time. She appears well-developed and well-nourished. No distress.  HENT:  Head: Normocephalic and atraumatic.  Mouth/Throat: Oropharynx is clear and moist.  Eyes: Conjunctivae and EOM are normal. Pupils are equal, round, and reactive to light.  Neck: Normal range of motion.  Cardiovascular: Normal rate and regular rhythm.   No murmur heard. Pulmonary/Chest:  Effort normal and breath sounds normal. No respiratory distress. She has no wheezes. She has no rales.  Abdominal: Soft. She exhibits no distension. There is no tenderness. There is no rebound and no guarding.  Musculoskeletal: Normal range of motion. She exhibits no edema and no tenderness.  Neurological: She is alert and oriented to person, place, and time. She has normal strength. No cranial nerve deficit or sensory deficit. Coordination and gait normal.  Strength is equal in all extremities. Normal sensation to light touch in bilateral hands and feet. Normal distal pulses.  Skin: Skin is warm and dry. No rash noted.  Psychiatric: She has a normal mood and affect. Her behavior is normal.    ED Course  Procedures   Results for orders  placed during the hospital encounter of 07/29/12  POCT I-STAT, CHEM 8      Result Value Range   Sodium 137  135 - 145 mEq/L   Potassium 3.8  3.5 - 5.1 mEq/L   Chloride 104  96 - 112 mEq/L   BUN 6  6 - 23 mg/dL   Creatinine, Ser 2.95  0.50 - 1.10 mg/dL   Glucose, Bld 284 (*) 70 - 99 mg/dL   Calcium, Ion 1.32 (*) 1.12 - 1.23 mmol/L   TCO2 21  0 - 100 mmol/L   Hemoglobin 15.0  12.0 - 15.0 g/dL   HCT 44.0  10.2 - 72.5 %        1. Headache   2. Paresthesia   3. Hyperglycemia       MDM  12:35 AM patient seen and evaluated. Patient seeing appears well in no acute distress. Normal respirations and O2 sats.  Patient continues to feel improved. No concerning findings on EKG. I-STAT does show slightly elevated blood sugar. No clear signs to indicate diabetes at this time. Will advise patient to followup with PCP for additional testing.    Date: 07/30/2012  Rate: 81  Rhythm: normal sinus rhythm  QRS Axis: normal  Intervals: normal  ST/T Wave abnormalities: nonspecific T wave changes  Conduction Disutrbances:none  Narrative Interpretation:   Old EKG Reviewed: unchanged from 11/09/2010       Angus Seller, PA-C 07/30/12 [phone]

## 2012-07-30 NOTE — ED Provider Notes (Signed)
Medical screening examination/treatment/procedure(s) were performed by non-physician practitioner and as supervising physician I was immediately available for consultation/collaboration.  Darleene Cumpian M Robby Bulkley, MD 07/30/12 0517 

## 2013-02-16 ENCOUNTER — Emergency Department (HOSPITAL_COMMUNITY): Payer: Managed Care, Other (non HMO)

## 2013-02-16 ENCOUNTER — Encounter (HOSPITAL_COMMUNITY): Payer: Self-pay | Admitting: Emergency Medicine

## 2013-02-16 ENCOUNTER — Emergency Department (HOSPITAL_COMMUNITY)
Admission: EM | Admit: 2013-02-16 | Discharge: 2013-02-16 | Disposition: A | Discharge status: Home / Self Care | Payer: Managed Care, Other (non HMO) | Attending: Emergency Medicine | Admitting: Emergency Medicine

## 2013-02-16 DIAGNOSIS — R197 Diarrhea, unspecified: Secondary | ICD-10-CM | POA: Insufficient documentation

## 2013-02-16 DIAGNOSIS — F3289 Other specified depressive episodes: Secondary | ICD-10-CM | POA: Insufficient documentation

## 2013-02-16 DIAGNOSIS — R000 Tachycardia, unspecified: Secondary | ICD-10-CM | POA: Insufficient documentation

## 2013-02-16 DIAGNOSIS — O98819 Other maternal infectious and parasitic diseases complicating pregnancy, unspecified trimester: Secondary | ICD-10-CM | POA: Insufficient documentation

## 2013-02-16 DIAGNOSIS — A599 Trichomoniasis, unspecified: Secondary | ICD-10-CM

## 2013-02-16 DIAGNOSIS — A5900 Urogenital trichomoniasis, unspecified: Secondary | ICD-10-CM | POA: Insufficient documentation

## 2013-02-16 DIAGNOSIS — F411 Generalized anxiety disorder: Secondary | ICD-10-CM | POA: Insufficient documentation

## 2013-02-16 DIAGNOSIS — F329 Major depressive disorder, single episode, unspecified: Secondary | ICD-10-CM | POA: Insufficient documentation

## 2013-02-16 DIAGNOSIS — O99330 Smoking (tobacco) complicating pregnancy, unspecified trimester: Secondary | ICD-10-CM | POA: Insufficient documentation

## 2013-02-16 DIAGNOSIS — Z79899 Other long term (current) drug therapy: Secondary | ICD-10-CM | POA: Insufficient documentation

## 2013-02-16 DIAGNOSIS — N390 Urinary tract infection, site not specified: Secondary | ICD-10-CM

## 2013-02-16 DIAGNOSIS — O200 Threatened abortion: Secondary | ICD-10-CM | POA: Insufficient documentation

## 2013-02-16 DIAGNOSIS — O169 Unspecified maternal hypertension, unspecified trimester: Secondary | ICD-10-CM | POA: Insufficient documentation

## 2013-02-16 DIAGNOSIS — O9989 Other specified diseases and conditions complicating pregnancy, childbirth and the puerperium: Secondary | ICD-10-CM | POA: Insufficient documentation

## 2013-02-16 DIAGNOSIS — O2390 Unspecified genitourinary tract infection in pregnancy, unspecified trimester: Secondary | ICD-10-CM | POA: Insufficient documentation

## 2013-02-16 DIAGNOSIS — O99340 Other mental disorders complicating pregnancy, unspecified trimester: Secondary | ICD-10-CM | POA: Insufficient documentation

## 2013-02-16 DIAGNOSIS — Z792 Long term (current) use of antibiotics: Secondary | ICD-10-CM | POA: Insufficient documentation

## 2013-02-16 LAB — URINE MICROSCOPIC-ADD ON

## 2013-02-16 LAB — CBC WITH DIFFERENTIAL/PLATELET
BASOS ABS: 0 10*3/uL (ref 0.0–0.1)
BASOS PCT: 0 % (ref 0–1)
EOS ABS: 0 10*3/uL (ref 0.0–0.7)
Eosinophils Relative: 0 % (ref 0–5)
HCT: 35.3 % — ABNORMAL LOW (ref 36.0–46.0)
HEMOGLOBIN: 12.2 g/dL (ref 12.0–15.0)
Lymphocytes Relative: 5 % — ABNORMAL LOW (ref 12–46)
Lymphs Abs: 1.2 10*3/uL (ref 0.7–4.0)
MCH: 31.7 pg (ref 26.0–34.0)
MCHC: 34.6 g/dL (ref 30.0–36.0)
MCV: 91.7 fL (ref 78.0–100.0)
MONO ABS: 1.2 10*3/uL — AB (ref 0.1–1.0)
MONOS PCT: 6 % (ref 3–12)
NEUTROS PCT: 89 % — AB (ref 43–77)
Neutro Abs: 19 10*3/uL — ABNORMAL HIGH (ref 1.7–7.7)
Platelets: 213 10*3/uL (ref 150–400)
RBC: 3.85 MIL/uL — ABNORMAL LOW (ref 3.87–5.11)
RDW: 14.2 % (ref 11.5–15.5)
WBC: 21.3 10*3/uL — ABNORMAL HIGH (ref 4.0–10.5)

## 2013-02-16 LAB — URINALYSIS, ROUTINE W REFLEX MICROSCOPIC
Bilirubin Urine: NEGATIVE
GLUCOSE, UA: NEGATIVE mg/dL
Ketones, ur: NEGATIVE mg/dL
Nitrite: NEGATIVE
PH: 5.5 (ref 5.0–8.0)
Protein, ur: 100 mg/dL — AB
SPECIFIC GRAVITY, URINE: 1.015 (ref 1.005–1.030)
Urobilinogen, UA: 1 mg/dL (ref 0.0–1.0)

## 2013-02-16 LAB — POCT PREGNANCY, URINE: PREG TEST UR: POSITIVE — AB

## 2013-02-16 LAB — COMPREHENSIVE METABOLIC PANEL
ALBUMIN: 3 g/dL — AB (ref 3.5–5.2)
ALT: 8 U/L (ref 0–35)
AST: 13 U/L (ref 0–37)
Alkaline Phosphatase: 81 U/L (ref 39–117)
BUN: 10 mg/dL (ref 6–23)
CO2: 21 mEq/L (ref 19–32)
CREATININE: 1.29 mg/dL — AB (ref 0.50–1.10)
Calcium: 9 mg/dL (ref 8.4–10.5)
Chloride: 96 mEq/L (ref 96–112)
GFR calc Af Amer: 61 mL/min — ABNORMAL LOW (ref >90)
GFR calc non Af Amer: 52 mL/min — ABNORMAL LOW (ref >90)
Glucose, Bld: 164 mg/dL — ABNORMAL HIGH (ref 70–99)
POTASSIUM: 3.7 meq/L (ref 3.7–5.3)
Sodium: 135 mEq/L — ABNORMAL LOW (ref 137–147)
TOTAL PROTEIN: 7.7 g/dL (ref 6.0–8.3)
Total Bilirubin: 0.6 mg/dL (ref 0.3–1.2)

## 2013-02-16 LAB — WET PREP, GENITAL: Yeast Wet Prep HPF POC: NONE SEEN

## 2013-02-16 LAB — CG4 I-STAT (LACTIC ACID): Lactic Acid, Venous: 2.23 mmol/L — ABNORMAL HIGH (ref 0.5–2.2)

## 2013-02-16 LAB — HIV ANTIBODY (ROUTINE TESTING W REFLEX): HIV: NONREACTIVE

## 2013-02-16 LAB — SYPHILIS: RPR W/REFLEX TO RPR TITER AND TREPONEMAL ANTIBODIES, TRADITIONAL SCREENING AND DIAGNOSIS ALGORITHM: RPR Ser Ql: NONREACTIVE

## 2013-02-16 LAB — HCG, QUANTITATIVE, PREGNANCY: hCG, Beta Chain, Quant, S: 2722 m[IU]/mL — ABNORMAL HIGH (ref <5)

## 2013-02-16 LAB — ABO/RH: ABO/RH(D): O POS

## 2013-02-16 MED ORDER — DEXTROSE 5 % IV SOLN
1.0000 g | Freq: Once | INTRAVENOUS | Status: AC
  Administered 2013-02-16: 1 g via INTRAVENOUS
  Filled 2013-02-16: qty 10

## 2013-02-16 MED ORDER — CEPHALEXIN 500 MG PO CAPS: ORAL_CAPSULE | ORAL | Status: DC

## 2013-02-16 MED ORDER — ACETAMINOPHEN 325 MG PO TABS
650.0000 mg | ORAL_TABLET | Freq: Once | ORAL | Status: AC
  Administered 2013-02-16: 650 mg via ORAL
  Filled 2013-02-16: qty 2

## 2013-02-16 MED ORDER — METRONIDAZOLE 500 MG PO TABS
2000.0000 mg | ORAL_TABLET | Freq: Once | ORAL | Status: AC
  Administered 2013-02-16: 2000 mg via ORAL
  Filled 2013-02-16: qty 4

## 2013-02-16 MED ORDER — SODIUM CHLORIDE 0.9 % IV SOLN
INTRAVENOUS | Status: DC
  Administered 2013-02-16: 18:00:00 via INTRAVENOUS

## 2013-02-16 MED ORDER — ONDANSETRON HCL 4 MG/2ML IJ SOLN
4.0000 mg | Freq: Once | INTRAMUSCULAR | Status: AC
  Administered 2013-02-16: 4 mg via INTRAVENOUS
  Filled 2013-02-16: qty 2

## 2013-02-16 MED ORDER — SODIUM CHLORIDE 0.9 % IV BOLUS (SEPSIS)
2000.0000 mL | Freq: Once | INTRAVENOUS | Status: AC
  Administered 2013-02-16: 2000 mL via INTRAVENOUS

## 2013-02-16 MED ORDER — AZITHROMYCIN 250 MG PO TABS
1000.0000 mg | ORAL_TABLET | Freq: Once | ORAL | Status: AC
  Administered 2013-02-16: 1000 mg via ORAL
  Filled 2013-02-16: qty 4

## 2013-02-16 MED ORDER — AZITHROMYCIN 1 G PO PACK: 1.0000 g | PACK | Freq: Once | ORAL | Status: DC

## 2013-02-16 MED ORDER — HYDROMORPHONE HCL PF 1 MG/ML IJ SOLN
1.0000 mg | Freq: Once | INTRAMUSCULAR | Status: AC
Start: 2013-02-16 — End: 2013-02-16
  Administered 2013-02-16: 1 mg via INTRAVENOUS
  Filled 2013-02-16: qty 1

## 2013-02-16 MED ORDER — SODIUM CHLORIDE 0.9 % IV BOLUS (SEPSIS)
1000.0000 mL | Freq: Once | INTRAVENOUS | Status: AC
  Administered 2013-02-16: 1000 mL via INTRAVENOUS

## 2013-02-16 NOTE — ED Notes (Signed)
Per pt having lower abdominal pain with thick discharge. sts she has been having a fever and boil under left breast. sts she just doesn't feel well. sts also she missed her period.

## 2013-02-16 NOTE — ED Provider Notes (Signed)
CSN: [id number]     Arrival date & time 02/16/13  1204 History   First MD Initiated Contact with Patient 02/16/13 1445     Chief Complaint  Patient presents with  . Abdominal Pain  . Vaginal Discharge   (Consider location/radiation/quality/duration/timing/severity/associated sxs/prior Treatment) HPI 1-2 weeks of cough chills body aches no shortness breath rash or confusion now has 3-4 days of diarrhea and constant diffuse abdominal pain as well as one day of mild vaginal bleeding and vaginal discharge last menstrual period near the end of November usually regular missed period in December; a month ago had a small cyst or abscess drain left chest wall under her left breast that is no longer draining and is not red now or painful now; no shortness of breath no dysuria    Past Medical History  Diagnosis Date  . Hypertension   . Depression   . Anxiety    History reviewed. No pertinent past surgical history. History reviewed. No pertinent family history. History  Substance Use Topics  . Smoking status: Current Every Day Smoker -- 0.50 packs/day    Types: Cigarettes  . Smokeless tobacco: Never Used  . Alcohol Use: Yes     Comment: Patient states she drinks daily- 12 pack beer and a couple shots of liquor   OB History   Grav Para Term Preterm Abortions TAB SAB Ect Mult Living   1              Review of Systems 10 Systems reviewed and are negative for acute change except as noted in the HPI. Allergies  Review of patient's allergies indicates no known allergies.  Home Medications   Current Outpatient Rx  Name  Route  Sig  Dispense  Refill  . ALPRAZolam (XANAX) 0.25 MG tablet   Oral   Take 0.5 mg by mouth at bedtime as needed. Anxiety         . citalopram (CELEXA) 10 MG tablet   Oral   Take 10 mg by mouth daily.         Marland Kitchen. ibuprofen (ADVIL,MOTRIN) 800 MG tablet   Oral   Take 800 mg by mouth every 8 (eight) hours as needed for cramping.         Marland Kitchen.  losartan-hydrochlorothiazide (HYZAAR) 50-12.5 MG per tablet   Oral   Take 1 tablet by mouth daily.         Marland Kitchen. Phenylephrine-Pheniramine-DM (THERAFLU COLD & COUGH PO)   Oral   Take 1 packet by mouth daily as needed (congestion).         . cephALEXin (KEFLEX) 500 MG capsule      2 caps po bid x 7 days   28 capsule   0    BP 101/57  Pulse 120  Temp(Src) 103 F (39.4 C) (Oral)  Resp 20  Wt 236 lb 14.4 oz (107.457 kg)  SpO2 99%  LMP 01/04/2013 Physical Exam  Nursing note and vitals reviewed. Constitutional:  Awake, alert, nontoxic appearance.  HENT:  Head: Atraumatic.  Eyes: Right eye exhibits no discharge. Left eye exhibits no discharge.  Neck: Neck supple.  Cardiovascular: Regular rhythm.   No murmur heard. Tachycardic  Pulmonary/Chest: Effort normal and breath sounds normal. No respiratory distress. She has no wheezes. She has no rales. She exhibits no tenderness.  Abdominal: Soft. Bowel sounds are normal. She exhibits no distension and no mass. There is tenderness. There is no rebound and no guarding.  abdomen mild diffuse tenderness without rebound Pelvic:  scant vag bleed; no pus, NT bimanual exam no CMT and adnexae NT and os closed (chaparone present); no CVAT  Genitourinary:  Pelvic: scant vag bleed; no pus, NT bimanual exam no CMT and adnexae NT and os closed (chaparone present)  Musculoskeletal: She exhibits no tenderness.  Baseline ROM, no obvious new focal weakness.  Neurological: She is alert.  Mental status and motor strength appears baseline for patient and situation.  Skin: No rash noted.  Psychiatric: She has a normal mood and affect.    ED Course  Procedures (including critical care time) Pt feels improved after observation and/or treatment in ED.Patient / Family / Caregiver informed of clinical course, understand medical decision-making process, and agree with plan. Labs Review Labs Reviewed  WET PREP, GENITAL - Abnormal; Notable for the following:     Trich, Wet Prep FEW (*)    Clue Cells Wet Prep HPF POC FEW (*)    WBC, Wet Prep HPF POC MANY (*)    All other components within normal limits  CBC WITH DIFFERENTIAL - Abnormal; Notable for the following:    WBC 21.3 (*)    RBC 3.85 (*)    HCT 35.3 (*)    Neutrophils Relative % 89 (*)    Neutro Abs 19.0 (*)    Lymphocytes Relative 5 (*)    Monocytes Absolute 1.2 (*)    All other components within normal limits  COMPREHENSIVE METABOLIC PANEL - Abnormal; Notable for the following:    Sodium 135 (*)    Glucose, Bld 164 (*)    Creatinine, Ser 1.29 (*)    Albumin 3.0 (*)    GFR calc non Af Amer 52 (*)    GFR calc Af Amer 61 (*)    All other components within normal limits  URINALYSIS, ROUTINE W REFLEX MICROSCOPIC - Abnormal; Notable for the following:    Color, Urine AMBER (*)    APPearance TURBID (*)    Hgb urine dipstick LARGE (*)    Protein, ur 100 (*)    Leukocytes, UA LARGE (*)    All other components within normal limits  URINE MICROSCOPIC-ADD ON - Abnormal; Notable for the following:    Squamous Epithelial / LPF MANY (*)    Bacteria, UA MANY (*)    All other components within normal limits  HCG, QUANTITATIVE, PREGNANCY - Abnormal; Notable for the following:    hCG, Beta Chain, Quant, S 2722 (*)    All other components within normal limits  POCT PREGNANCY, URINE - Abnormal; Notable for the following:    Preg Test, Ur POSITIVE (*)    All other components within normal limits  CG4 I-STAT (LACTIC ACID) - Abnormal; Notable for the following:    Lactic Acid, Venous 2.23 (*)    All other components within normal limits  URINE CULTURE  GC/CHLAMYDIA PROBE AMP  RPR  HIV ANTIBODY (ROUTINE TESTING)  ABO/RH   Imaging Review Dg Chest 2 View  02/16/2013   CLINICAL DATA:  Pain  EXAM: CHEST  2 VIEW  COMPARISON:  November 09, 2010  FINDINGS: Lungs are clear. The heart size and pulmonary vascularity are normal. No adenopathy. No bone lesions. No pneumothorax.  IMPRESSION: No  abnormality noted.   Electronically Signed   By: Bretta Bang M.D.   On: 02/16/2013 17:09   US Ob Limited  02/16/2013   CLINICAL DATA:  Vaginal bleeding and abdominal pain.  EXAM: OBSTETRIC <14 WK [place] AND TRANSVAGINAL OB US  TECHNIQUE: Both transabdominal and transvaginal  ultrasound examinations were performed for complete evaluation of the gestation as well as the maternal uterus, adnexal regions, and pelvic cul-de-sac. Transvaginal technique was performed to assess early pregnancy.  COMPARISON:  None.  FINDINGS: Intrauterine gestational sac: Single.  Yolk sac:  Yes  Embryo:  No  Cardiac Activity: No  MSD:  7.4  mm   5 w   3  d  [place] EDC: 10/16/2013  Maternal uterus/adnexae: Tiny subchorionic hemorrhage. Normal right ovary. 2.6 cm corpus luteum cyst on the left ovary. No free fluid.  IMPRESSION: Intrauterine gestational sac, 5 weeks 3 days gestation. Possible tiny subchorionic hemorrhage. No visible embryo at this time.   Electronically Signed   By: Geanie Cooley M.D.   On: 02/16/2013 18:26   US Ob Transvaginal  02/16/2013   CLINICAL DATA:  Vaginal bleeding and abdominal pain.  EXAM: OBSTETRIC <14 WK [place] AND TRANSVAGINAL OB US  TECHNIQUE: Both transabdominal and transvaginal ultrasound examinations were performed for complete evaluation of the gestation as well as the maternal uterus, adnexal regions, and pelvic cul-de-sac. Transvaginal technique was performed to assess early pregnancy.  COMPARISON:  None.  FINDINGS: Intrauterine gestational sac: Single.  Yolk sac:  Yes  Embryo:  No  Cardiac Activity: No  MSD:  7.4  mm   5 w   3  d  [place] EDC: 10/16/2013  Maternal uterus/adnexae: Tiny subchorionic hemorrhage. Normal right ovary. 2.6 cm corpus luteum cyst on the left ovary. No free fluid.  IMPRESSION: Intrauterine gestational sac, 5 weeks 3 days gestation. Possible tiny subchorionic hemorrhage. No visible embryo at this time.   Electronically Signed   By: Geanie Cooley M.D.   On: 02/16/2013 18:26    EKG  Interpretation   None       MDM   1. Threatened miscarriage in early pregnancy   2. UTI (urinary tract infection)   3. Trichomoniasis    I doubt any other EMC precluding discharge at this time including, but not necessarily limited to the following:ectopic, sepsis.    Hurman Horn, MD 02/17/13 [phone]

## 2013-02-16 NOTE — ED Notes (Signed)
NOTIFIED DR. Fonnie JarvisBEDNAR IN PERSON OF PATIENTS LAB RESULTS  CG4 LACTIC ACID  02/16/2013.

## 2013-02-16 NOTE — ED Notes (Signed)
Patient transported to Ultrasound 

## 2013-02-16 NOTE — ED Notes (Signed)
Pt c/o sharp chest pain under the left breast. Dr. Fonnie JarvisBednar made aware.

## 2013-02-16 NOTE — Discharge Instructions (Signed)
Bleeding during the first 20 weeks of pregnancy is common. This is sometimes called a threatened miscarriage. This is a pregnancy that is threatening to end before the twentieth week of pregnancy.  Miscarriages occur in 15 to 20% of all pregnancies and usually occur during the first 13 weeks of the pregnancy. The exact cause of a miscarriage is usually never known. A miscarriage is natures way of ending a pregnancy that is abnormal or would not make it to term.  HOME CARE INSTRUCTIONS DO NOT USE TAMPONS. Do not douche, have sexual intercourse or orgasms until approved by your caregiver.  Call for re-evaluation of your pregnancy and possible repeat blood test. Re-evaluation often occurs after 2 days.  If you are Rh negative and the father is Rh positive or you do not know the fathers blood type, you may receive a shot (Rh immune globulin) to help prevent abnormal antibodies that can develop and affect the baby in any future pregnancies. SEEK IMMEDIATE MEDICAL ATTENTION IF: You have severe cramps in your stomach, back, or abdomen.  You have a sudden onset of severe pain in the lower part of your abdomen.  You run an unexplained temperature of 101 F (38.3 C) or higher.  You pass large clots or tissue. Save any tissue for your caregiver to inspect.  Your bleeding increases or you become light-headed, weak, or have fainting episodes.   Abdominal (belly) pain can be caused by many things. Your caregiver performed an examination and possibly ordered blood/urine tests and imaging (CT scan, x-rays, ultrasound). Many cases can be observed and treated at home after initial evaluation in the emergency department. Even though you are being discharged home, abdominal pain can be unpredictable. Therefore, you need a repeated exam if your pain does not resolve, returns, or worsens. Most patients with abdominal pain don't have to be admitted to the hospital or have surgery, but serious problems like appendicitis and  gallbladder attacks can start out as nonspecific pain. Many abdominal conditions cannot be diagnosed in one visit, so follow-up evaluations are very important. SEEK IMMEDIATE MEDICAL ATTENTION IF: The pain does not go away or becomes severe.  A temperature above 101 develops.  Repeated vomiting occurs (multiple episodes).  The pain becomes localized to portions of the abdomen. The right side could possibly be appendicitis. In an adult, the left lower portion of the abdomen could be colitis or diverticulitis.  Blood is being passed in stools or vomit (bright red or black tarry stools).  Return also if you develop chest pain, difficulty breathing, dizziness or fainting, or become confused, poorly responsive, or inconsolable (young children).

## 2013-02-16 NOTE — ED Notes (Signed)
NOTIFIED NURSE AT TRIAGE OF PATIENTS LAB RESULTS @ 13:02 PM , 02/16/2013.

## 2013-02-17 LAB — GC/CHLAMYDIA PROBE AMP
CT Probe RNA: NEGATIVE
GC Probe RNA: NEGATIVE

## 2013-02-19 LAB — URINE CULTURE

## 2013-02-20 ENCOUNTER — Inpatient Hospital Stay (HOSPITAL_COMMUNITY)
Admission: AD | Admit: 2013-02-20 | Discharge: 2013-02-20 | Disposition: A | Discharge status: Home / Self Care | Payer: Managed Care, Other (non HMO) | Source: Ambulatory Visit | Attending: Obstetrics & Gynecology | Admitting: Obstetrics & Gynecology

## 2013-02-20 ENCOUNTER — Telehealth (HOSPITAL_COMMUNITY): Payer: Self-pay | Admitting: Emergency Medicine

## 2013-02-20 ENCOUNTER — Encounter (HOSPITAL_COMMUNITY): Payer: Self-pay | Admitting: *Deleted

## 2013-02-20 DIAGNOSIS — O208 Other hemorrhage in early pregnancy: Secondary | ICD-10-CM

## 2013-02-20 DIAGNOSIS — O039 Complete or unspecified spontaneous abortion without complication: Secondary | ICD-10-CM

## 2013-02-20 DIAGNOSIS — O468X1 Other antepartum hemorrhage, first trimester: Secondary | ICD-10-CM

## 2013-02-20 DIAGNOSIS — R51 Headache: Secondary | ICD-10-CM | POA: Insufficient documentation

## 2013-02-20 DIAGNOSIS — O209 Hemorrhage in early pregnancy, unspecified: Secondary | ICD-10-CM | POA: Insufficient documentation

## 2013-02-20 DIAGNOSIS — O26859 Spotting complicating pregnancy, unspecified trimester: Secondary | ICD-10-CM

## 2013-02-20 DIAGNOSIS — O418X10 Other specified disorders of amniotic fluid and membranes, first trimester, not applicable or unspecified: Secondary | ICD-10-CM

## 2013-02-20 LAB — CBC
HCT: 32.4 % — ABNORMAL LOW (ref 36.0–46.0)
Hemoglobin: 11.6 g/dL — ABNORMAL LOW (ref 12.0–15.0)
MCH: 31.6 pg (ref 26.0–34.0)
MCHC: 35.8 g/dL (ref 30.0–36.0)
MCV: 88.3 fL (ref 78.0–100.0)
PLATELETS: 254 10*3/uL (ref 150–400)
RBC: 3.67 MIL/uL — ABNORMAL LOW (ref 3.87–5.11)
RDW: 14.1 % (ref 11.5–15.5)
WBC: 12.6 10*3/uL — ABNORMAL HIGH (ref 4.0–10.5)

## 2013-02-20 LAB — URINALYSIS, ROUTINE W REFLEX MICROSCOPIC
Bilirubin Urine: NEGATIVE
GLUCOSE, UA: NEGATIVE mg/dL
KETONES UR: NEGATIVE mg/dL
Nitrite: NEGATIVE
PROTEIN: NEGATIVE mg/dL
Specific Gravity, Urine: <1.005 — ABNORMAL LOW (ref 1.005–1.030)
UROBILINOGEN UA: 0.2 mg/dL (ref 0.0–1.0)
pH: 6 (ref 5.0–8.0)

## 2013-02-20 LAB — URINE MICROSCOPIC-ADD ON

## 2013-02-20 LAB — HCG, QUANTITATIVE, PREGNANCY: hCG, Beta Chain, Quant, S: 39 m[IU]/mL — ABNORMAL HIGH (ref <5)

## 2013-02-20 MED ORDER — KETOROLAC TROMETHAMINE 60 MG/2ML IM SOLN: 60.0000 mg | Freq: Once | INTRAMUSCULAR | Status: DC

## 2013-02-20 MED ORDER — OXYCODONE-ACETAMINOPHEN 5-325 MG PO TABS
2.0000 | ORAL_TABLET | Freq: Once | ORAL | Status: AC
  Administered 2013-02-20: 2 via ORAL
  Filled 2013-02-20: qty 2

## 2013-02-20 NOTE — Discharge Instructions (Signed)
Vaginal Bleeding During Pregnancy, First Trimester °A small amount of bleeding (spotting) from the vagina is relatively common in early pregnancy. It usually stops on its own. Various things may cause bleeding or spotting in early pregnancy. Some bleeding may be related to the pregnancy, and some may not. In most cases, the bleeding is normal and is not a problem. However, bleeding can also be a sign of something serious. Be sure to tell your health care provider about any vaginal bleeding right away. °Some possible causes of vaginal bleeding during the first trimester include: °· Infection or inflammation of the cervix. °· Growths (polyps) on the cervix. °· Miscarriage or threatened miscarriage. °· Pregnancy tissue has developed outside of the uterus and in a fallopian tube (tubal pregnancy). °· Tiny cysts have developed in the uterus instead of pregnancy tissue (molar pregnancy). °HOME CARE INSTRUCTIONS  °Watch your condition for any changes. The following actions may help to lessen any discomfort you are feeling: °· Follow your health care provider's instructions for limiting your activity. If your health care provider orders bed rest, you may need to stay in bed and only get up to use the bathroom. However, your health care provider may allow you to continue light activity. °· If needed, make plans for someone to help with your regular activities and responsibilities while you are on bed rest. °· Keep track of the number of pads you use each day, how often you change pads, and how soaked (saturated) they are. Write this down. °· Do not use tampons. Do not douche. °· Do not have sexual intercourse or orgasms until approved by your health care provider. °· If you pass any tissue from your vagina, save the tissue so you can show it to your health care provider. °· Only take over-the-counter or prescription medicines as directed by your health care provider. °· Do not take aspirin because it can make you  bleed. °· Keep all follow-up appointments as directed by your health care provider. °SEEK MEDICAL CARE IF: °· You have any vaginal bleeding during any part of your pregnancy. °· You have cramps or labor pains. °SEEK IMMEDIATE MEDICAL CARE IF:  °· You have severe cramps in your back or belly (abdomen). °· You have a fever, not controlled by medicine. °· You pass large clots or tissue from your vagina. °· Your bleeding increases. °· You feel lightheaded or weak, or you have fainting episodes. °· You have chills. °· You are leaking fluid or have a gush of fluid from your vagina. °· You pass out while having a bowel movement. °MAKE SURE YOU: °· Understand these instructions. °· Will watch your condition. °· Will get help right away if you are not doing well or get worse. °Document Released: 11/06/2004 Document Revised: 11/17/2012 Document Reviewed: 10/04/2012 °[employer]® Patient Information ©2014 [employer]. ° °

## 2013-02-20 NOTE — MAU Provider Note (Signed)
History     CSN: [id number]  Arrival date and time: 02/20/13 [id number]   None     Chief Complaint  Patient presents with  . Vaginal Bleeding   HPI  Ms Grace Nolan is a 38 yo G2P1001 @ 7156w5d by LMP who presents for vaginal bleeding. Has been spotting since 1/7. Last night noticed that her bleeding seemed to be bleeding slightly heavier. This AM, she noticed that she is also starting to cramp which was new to her. Bleeding seems to be heavier, toilet water is red compared to earlier. Has taken tylenol but doesn't seem to touch it.   Aslo having a HA- frontal throbbing. 7/10. No photophonophobia. Some nausea. Some diarrhea since being on keflex. No vaginal discharge. No dysuria.  No SI, HI.   Se[facility] in [facility] ED at [facility] 1/7 for similar complaints. Workup at that time included a diagnosis of trichomoniasis, and an US showing a Gestational sac at 7685w3d without visible embryo and a possible tiny subchorionic hemorrhage. She was sent home with return precautions and treatment for trichomoniasis.  Her blood type at that visit was O+.  OB History   Grav Para Term Preterm Abortions TAB SAB Ect Mult Living   2 1        1       Past Medical History  Diagnosis Date  . Hypertension   . Depression   . Anxiety     Past Surgical History  Procedure Laterality Date  . No past surgeries      History reviewed. No pertinent family history.  History  Substance Use Topics  . Smoking status: Current Every Day Smoker -- 0.50 packs/day    Types: Cigarettes  . Smokeless tobacco: Never Used  . Alcohol Use: Yes     Comment: Patient states she drinks daily- 12 pack beer and a couple shots of liquor  hasn't had any alcohol since finding out she was pregnant    Allergies: No Known Allergies  Prescriptions prior to admission  Medication Sig Dispense Refill  . ALPRAZolam (XANAX) 0.25 MG tablet Take 0.5 mg by mouth at bedtime as needed. Anxiety      . cephALEXin (KEFLEX) 500 MG capsule 2 caps po bid x 7 days   28 capsule  0  . citalopram (CELEXA) 10 MG tablet Take 10 mg by mouth daily.      Marland Kitchen. ibuprofen (ADVIL,MOTRIN) 800 MG tablet Take 800 mg by mouth every 8 (eight) hours as needed for cramping.      Marland Kitchen. losartan-hydrochlorothiazide (HYZAAR) 50-12.5 MG per tablet Take 1 tablet by mouth daily.      Marland Kitchen. Phenylephrine-Pheniramine-DM (THERAFLU COLD & COUGH PO) Take 1 packet by mouth daily as needed (congestion).        Review of Systems  Constitutional: Negative for fever and chills.  Eyes: Negative for blurred vision.  Respiratory: Negative for cough and shortness of breath.   Cardiovascular: Negative for chest pain.  Gastrointestinal: Positive for nausea and vomiting.  Genitourinary: Negative for dysuria.  Skin: Negative for rash.  Neurological: Positive for headaches.  Psychiatric/Behavioral: Positive for depression.  All other systems reviewed and are negative.   Physical Exam   Blood pressure 113/75, pulse 75, temperature 98.6 F (37 C), temperature source Oral, resp. rate 18, height 5\' 9"  (1.753 m), weight 107.049 kg (236 lb), last menstrual period 01/04/2013.  Physical Exam  Constitutional: She is oriented to person, place, and time. She appears well-developed and well-nourished.  HENT:  Head: Normocephalic.  Eyes: EOM are normal. Pupils are equal, round, and reactive to light.  Neck: Neck supple.  Cardiovascular: Normal rate and regular rhythm.   Respiratory: Effort normal and breath sounds normal.  GI: Soft. Bowel sounds are normal. She exhibits no distension. There is no tenderness. There is no rebound and no guarding.  Genitourinary:  Normal external female genitalia Normal vagina without large amounts of discharge Scant amount of vaginal bleeding in the posterior fornix OS closed Slight tenderness of the uterus on bimanual exam Difficult to ascertain size due to maternal habitus and early gestation but seems appropriate vs. Possibly small for GA.   Musculoskeletal: She  exhibits no edema.  Neurological: She is alert and oriented to person, place, and time.  Skin: Skin is warm and dry.  Psychiatric: She has a normal mood and affect.    MAU Course  Procedures  MDM CBC Quant  O+ blood type already known  Assessment and Plan  38 yo G2P1001 @ [redacted]w[redacted]d by LMP who presents for vaginal bleeding in the setting of known early pregnancy with visualization of gestational sac on [place] 02/16/13 and known small subchorionic hemorrhage.    - pt's exam relatively benign currently. Scant bleeding in the vaginal vault without clots. Os closed.  - discussed no indication for repeat US at this time as no change in management would be taken.  - quant obtained for ability to follow and was 39, having decreased significantly from 4 days ago - concerning for missed vs. Complete abortion  - pain treated with percocet - O+ blood type - bleeding precautions and reasons to return given - pt has appt to set up OB f/u care @ Dr. Elsie Stain office on Thursday- urged her to keep that appt and ensure bleeding resolves and pain improved.  - d/c to home  Discussed with Dr. Marice Potter who was in agreement with above.   Jeannelle Wiens L 02/20/2013, 8:12 AM

## 2013-02-20 NOTE — MAU Note (Signed)
Pt presents with complaints of bright red vaginal bleeding that has gotten heavier since she found out she was pregnant. She was evaluated at [facility] on Wednesday.

## 2013-02-20 NOTE — MAU Note (Signed)
Pt states having lower abd pain bilaterally that began Wednesday. Had infection and took abx. Pain then began again and intensified this am when spotting began again.

## 2013-02-20 NOTE — ED Notes (Signed)
Post ED Visit - Positive Culture Follow-up  Culture report reviewed by antimicrobial stewardship pharmacist: []  Wes Dulaney, Pharm.D., BCPS []  Celedonio MiyamotoJeremy Frens, Pharm.D., BCPS []  Georgina PillionElizabeth Martin, [address] BCPS []  DawsonMinh Pham, [address]., BCPS, AAHIVP []  Estella HuskMichelle Turner, Pharm.D., BCPS, AAHIVP [x]  Harland GermanAndrew Meyer, Pharm.D., BCPS  Positive urine culture Treated with Keflex, organism sensitive to the same and no further patient follow-up is required at this time.  [place], Jenel LucksKylie 02/20/2013, 1:16 PM

## 2013-02-21 LAB — URINE CULTURE
CULTURE: NO GROWTH
Colony Count: NO GROWTH

## 2013-02-23 ENCOUNTER — Encounter: Payer: Self-pay | Admitting: Obstetrics & Gynecology

## 2013-02-28 ENCOUNTER — Inpatient Hospital Stay (HOSPITAL_COMMUNITY)
Admission: AD | Admit: 2013-02-28 | Discharge: 2013-02-28 | Disposition: A | Discharge status: Home / Self Care | Payer: Managed Care, Other (non HMO) | Source: Ambulatory Visit | Attending: Family Medicine | Admitting: Family Medicine

## 2013-02-28 NOTE — MAU Note (Signed)
Patient has an order form from Dr. Gaynell FaceMarshall to have a BHCG done x 2. This was to be done in the outpatient setting, not in MAU.

## 2013-03-03 ENCOUNTER — Encounter: Payer: Self-pay | Admitting: Obstetrics & Gynecology

## 2013-03-04 ENCOUNTER — Encounter: Payer: Self-pay | Admitting: Obstetrics & Gynecology

## 2013-03-07 ENCOUNTER — Encounter: Payer: Self-pay | Admitting: Obstetrics & Gynecology

## 2013-03-17 ENCOUNTER — Encounter: Payer: Self-pay | Admitting: Obstetrics & Gynecology

## 2013-03-28 ENCOUNTER — Ambulatory Visit: Payer: Managed Care, Other (non HMO) | Admitting: Obstetrics & Gynecology

## 2013-03-31 ENCOUNTER — Ambulatory Visit (INDEPENDENT_AMBULATORY_CARE_PROVIDER_SITE_OTHER): Payer: Managed Care, Other (non HMO) | Admitting: Obstetrics & Gynecology

## 2013-03-31 ENCOUNTER — Encounter: Payer: Self-pay | Admitting: Obstetrics & Gynecology

## 2013-03-31 VITALS — BP 152/91 | HR 68 | Temp 98.7°F | Ht 69.0 in | Wt 239.0 lb

## 2013-03-31 DIAGNOSIS — R3989 Other symptoms and signs involving the genitourinary system: Secondary | ICD-10-CM

## 2013-03-31 DIAGNOSIS — Z01419 Encounter for gynecological examination (general) (routine) without abnormal findings: Secondary | ICD-10-CM

## 2013-03-31 DIAGNOSIS — Z124 Encounter for screening for malignant neoplasm of cervix: Secondary | ICD-10-CM

## 2013-03-31 DIAGNOSIS — Z113 Encounter for screening for infections with a predominantly sexual mode of transmission: Secondary | ICD-10-CM

## 2013-03-31 DIAGNOSIS — N399 Disorder of urinary system, unspecified: Secondary | ICD-10-CM

## 2013-03-31 LAB — POCT URINALYSIS DIPSTICK
BILIRUBIN UA: NEGATIVE
GLUCOSE UA: NEGATIVE
Ketones, UA: NEGATIVE
LEUKOCYTES UA: NEGATIVE
NITRITE UA: NEGATIVE
Protein, UA: NEGATIVE
Spec Grav, UA: <=1.005
Urobilinogen, UA: NEGATIVE
pH, UA: 6

## 2013-03-31 NOTE — Progress Notes (Signed)
Subjective:     Grace Nolan is a 38 y.o. female here for a routine exam.  Current complaints: pt is having lower pelvic pain and pressure.  Pt describes pain as being sharp when move certain positions.  Pt states that there is constant pressure as well as urinary discomfort.  Pt was pregnant and was told she had miscarriage in January at approx [redacted] weeks gestation.  Pt had bleeding on 02/16/13 and continued until first of February.  Pt states that she has had constant discomfort since.  Personal health questionnaire reviewed: yes.   Gynecologic History Patient's last menstrual period was 01/04/2013. Contraception: none Last Pap: unsure. Results were: normal Last mammogram: n/a  Obstetric History OB History  Gravida Para Term Preterm AB SAB TAB Ectopic Multiple Living  2 1        1     # Outcome Date GA Lbr Len/2nd Weight Sex Delivery Anes PTL Lv  2 CUR           1 PAR              Review of Systems Pertinent items are noted in HPI.    Objective:    BP 152/91  Pulse 68  Temp(Src) 98.7 F (37.1 C)  Ht 5\' 9"  (1.753 m)  Wt 239 lb (108.41 kg)  BMI 35.28 kg/m2  LMP 01/04/2013  General Appearance:    Alert, cooperative, no distress, appears stated age  Abdomen:     Soft, non-tender, bowel sounds active all four quadrants,    no masses, no organomegaly  Genitalia:    Normal female without lesion, discharge or tenderness     Assessment:   Healthy female exam.   Plan:   A tubal ligation is planned Return postop

## 2013-04-01 ENCOUNTER — Encounter: Payer: Self-pay | Admitting: Obstetrics & Gynecology

## 2013-04-01 LAB — URINALYSIS, ROUTINE W REFLEX MICROSCOPIC
Bilirubin Urine: NEGATIVE
Glucose, UA: NEGATIVE mg/dL
Ketones, ur: NEGATIVE mg/dL
Nitrite: NEGATIVE
Protein, ur: NEGATIVE mg/dL
SPECIFIC GRAVITY, URINE: 1.006 (ref 1.005–1.030)
UROBILINOGEN UA: 0.2 mg/dL (ref 0.0–1.0)
pH: 6 (ref 5.0–8.0)

## 2013-04-01 LAB — URINALYSIS, MICROSCOPIC ONLY
Bacteria, UA: NONE SEEN
CASTS: NONE SEEN
CRYSTALS: NONE SEEN
Squamous Epithelial / LPF: NONE SEEN

## 2013-04-01 LAB — WET PREP BY MOLECULAR PROBE
Candida species: NEGATIVE
Gardnerella vaginalis: NEGATIVE
TRICHOMONAS VAG: NEGATIVE

## 2013-04-01 LAB — PAP IG W/ RFLX HPV ASCU

## 2013-04-01 LAB — GC/CHLAMYDIA PROBE AMP
CT Probe RNA: NEGATIVE
GC Probe RNA: NEGATIVE

## 2013-04-01 NOTE — Patient Instructions (Signed)
Sterilization Information, Female Female sterilization is a procedure to permanently prevent pregnancy. There are different ways to perform sterilization, but all either block or close the fallopian tubes so that your eggs cannot reach your uterus. If your egg cannot reach your uterus, sperm cannot fertilize the egg, and you cannot get pregnant.  Sterilization is performed by a surgical procedure. Sometimes these procedures are performed in a hospital while a patient is asleep. Sometimes they can be done in a clinic setting with the patient awake. The fallopian tubes can be surgically cut, tied, or sealed through a procedure called tubal ligation. The fallopian tubes can also be closed with clips or rings. Sterilization can also be done by placing a tiny coil into each fallopian tube, which causes scar tissue to grow inside the tube. The scar tissue then blocks the tubes.  Discuss sterilization with your caregiver to answer any concerns you or your partner may have. You may want to ask what type of sterilization your caregiver performs. Some caregivers may not perform all the various options. Sterilization is permanent and should only be done if you are sure you do not want children or do not want any more children. Having a sterilization reversed may not be successful.  STERILIZATION PROCEDURES  Laparoscopic sterilization. This is a surgical method performed at a time other than right after childbirth. Two incisions are made in the lower abdomen. A thin, lighted tube (laparoscope) is inserted into one of the incisions and is used to perform the procedure. The fallopian tubes are closed with a ring or a clip. An instrument that uses heat could be used to seal the tubes closed (electrocautery).   Mini-laparotomy. This is a surgical method done 1 or 2 days after giving birth. Typically, a small incision is made just below the belly button (umbilicus) and the fallopian tubes are exposed. The tubes can then be  sealed, tied, or cut.   Hysteroscopic sterilization. This is performed at a time other than right after childbirth. A tiny, spring-like coil is inserted through the cervix and uterus and placed into the fallopian tubes. The coil causes scaring and blocks the tubes. Other forms of contraception should be used for 3 months after the procedure to allow the scar tissue to form completely. Additionally, it is required hysterosalpingography be done 3 months later to ensure that the procedure was successful. Hysterosalpingography is a procedure that uses X-rays to look at your uterus and fallopian tubes after a material to make them show up better has been inserted. IS STERILIZATION SAFE? Sterilization is considered safe with very rare complications. Risks depend on the type of procedure you have. As with any surgical procedure, there are risks. Some risks of sterilization by any means include:   Bleeding.  Infection.  Reaction to anesthesia medicine.  Injury to surrounding organs. Risks specific to having hysteroscopic coils placed include:  The coils may not be placed correctly the first time.   The coils may move out of place.   The tubes may not get completely blocked after 3 months.   Injury to surrounding organs when placing the coil.  HOW EFFECTIVE IS FEMALE STERILIZATION? Sterilization is nearly 100% effective, but it can fail. Depending on the type of sterilization, the rate of failure can be as high as 3%. After hysteroscopic sterilization with placement of fallopian tube coils, you will need back-up birth control for 3 months after the procedure. Sterilization is effective for a lifetime.  BENEFITS OF STERILIZATION  It does   not affect your hormones, and therefore will not affect your menstrual periods, sexual desire, or performance.   It is effective for a lifetime.   It is safe.   You do not need to worry about getting pregnant. Keep in mind that if you had the  hysteroscopic placement procedure, you must wait 3 months after the procedure (or until your caregiver confirms) before pregnancy is not considered possible.   There are no side effects unlike other types of birth control (contraception).  DRAWBACKS OF STERILIZATION  You must be sure you do not want children or any more children. The procedure is permanent.   It does not provide protection against sexually transmitted infections (STIs).   The tubes can grow back together. If this happens, there is a risk of pregnancy. There is also an increased risk (50%) of pregnancy being an ectopic pregnancy. This is a pregnancy that happens outside of the uterus. Document Released: 07/16/2007 Document Revised: 07/29/2011 Document Reviewed: 05/15/2011 ExitCare Patient Information 2014 [employer].  

## 2013-04-02 LAB — URINE CULTURE
COLONY COUNT: NO GROWTH
ORGANISM ID, BACTERIA: NO GROWTH

## 2013-04-04 ENCOUNTER — Ambulatory Visit: Payer: Managed Care, Other (non HMO) | Admitting: Obstetrics & Gynecology

## 2013-05-04 ENCOUNTER — Ambulatory Visit: Payer: Managed Care, Other (non HMO) | Admitting: Obstetrics & Gynecology

## 2013-05-11 ENCOUNTER — Ambulatory Visit: Payer: Managed Care, Other (non HMO) | Admitting: Obstetrics & Gynecology

## 2013-05-25 ENCOUNTER — Encounter: Payer: Self-pay | Admitting: Obstetrics & Gynecology

## 2013-05-25 ENCOUNTER — Encounter: Payer: Self-pay | Admitting: *Deleted

## 2013-05-25 ENCOUNTER — Ambulatory Visit (INDEPENDENT_AMBULATORY_CARE_PROVIDER_SITE_OTHER): Payer: Managed Care, Other (non HMO) | Admitting: Obstetrics & Gynecology

## 2013-05-25 VITALS — BP 128/84 | HR 85 | Temp 98.8°F | Ht 70.0 in | Wt 245.0 lb

## 2013-05-25 DIAGNOSIS — Z113 Encounter for screening for infections with a predominantly sexual mode of transmission: Secondary | ICD-10-CM

## 2013-05-25 DIAGNOSIS — R109 Unspecified abdominal pain: Secondary | ICD-10-CM

## 2013-05-25 LAB — POCT URINALYSIS DIPSTICK
Bilirubin, UA: NEGATIVE
Blood, UA: NEGATIVE
Glucose, UA: NEGATIVE
Ketones, UA: NEGATIVE
Leukocytes, UA: NEGATIVE
Nitrite, UA: NEGATIVE
PH UA: 6
Protein, UA: NEGATIVE
Spec Grav, UA: 1.01
Urobilinogen, UA: NEGATIVE

## 2013-05-25 LAB — POCT URINE PREGNANCY: Preg Test, Ur: NEGATIVE

## 2013-05-25 MED ORDER — MEDROXYPROGESTERONE ACETATE 150 MG/ML IM SUSP: 150.0000 mg | INTRAMUSCULAR | Status: DC

## 2013-05-25 NOTE — Patient Instructions (Signed)
Pelvic Pain, Female °Female pelvic pain can be caused by many different things and start from a variety of places. Pelvic pain refers to pain that is located in the lower half of the abdomen and between your hips. The pain may occur over a short period of time (acute) or may be reoccurring (chronic). The cause of pelvic pain may be related to disorders affecting the female reproductive organs (gynecologic), but it may also be related to the bladder, kidney stones, an intestinal complication, or muscle or skeletal problems. Getting help right away for pelvic pain is important, especially if there has been severe, sharp, or a sudden onset of unusual pain. It is also important to get help right away because some types of pelvic pain can be life threatening.  °CAUSES  °Below are only some of the causes of pelvic pain. The causes of pelvic pain can be in one of several categories.  °· Gynecologic. °· Pelvic inflammatory disease. °· Sexually transmitted infection. °· Ovarian cyst or a twisted ovarian ligament (ovarian torsion). °· Uterine lining that grows outside the uterus (endometriosis). °· Fibroids, cysts, or tumors. °· Ovulation. °· Pregnancy. °· Pregnancy that occurs outside the uterus (ectopic pregnancy). °· Miscarriage. °· Labor. °· Abruption of the placenta or ruptured uterus. °· Infection. °· Uterine infection (endometritis). °· Bladder infection. °· Diverticulitis. °· Miscarriage related to a uterine infection (septic abortion). °· Bladder. °· Inflammation of the bladder (cystitis). °· Kidney stone(s). °· Gastrointenstinal. °· Constipation. °· Diverticulitis. °· Neurologic. °· Trauma. °· Feeling pelvic pain because of mental or emotional causes (psychosomatic). °· Cancers of the bowel or pelvis. °EVALUATION  °Your caregiver will want to take a careful history of your concerns. This includes recent changes in your health, a careful gynecologic history of your periods (menses), and a sexual history. Obtaining  your family history and medical history is also important. Your caregiver may suggest a pelvic exam. A pelvic exam will help identify the location and severity of the pain. It also helps in the evaluation of which organ system may be involved. In order to identify the cause of the pelvic pain and be properly treated, your caregiver may order tests. These tests may include:  °· A pregnancy test. °· Pelvic ultrasonography. °· An X-ray exam of the abdomen. °· A urinalysis or evaluation of vaginal discharge. °· Blood tests. °HOME CARE INSTRUCTIONS  °· Only take over-the-counter or prescription medicines for pain, discomfort, or fever as directed by your caregiver.   °· Rest as directed by your caregiver.   °· Eat a balanced diet.   °· Drink enough fluids to make your urine clear or pale yellow, or as directed.   °· Avoid sexual intercourse if it causes pain.   °· Apply warm or cold compresses to the lower abdomen depending on which one helps the pain.   °· Avoid stressful situations.   °· Keep a journal of your pelvic pain. Write down when it started, where the pain is located, and if there are things that seem to be associated with the pain, such as food or your menstrual cycle. °· Follow up with your caregiver as directed.   °SEEK MEDICAL CARE IF: °· Your medicine does not help your pain. °· You have abnormal vaginal discharge. °SEEK IMMEDIATE MEDICAL CARE IF:  °· You have heavy bleeding from the vagina.   °· Your pelvic pain increases.   °· You feel lightheaded or faint.   °· You have chills.   °· You have pain with urination or blood in your urine.   °· You have uncontrolled   diarrhea or vomiting.   °· You have a fever or persistent symptoms for more than 3 days. °· You have a fever and your symptoms suddenly get worse.   °· You are being physically or sexually abused.   °MAKE SURE YOU: °· Understand these instructions. °· Will watch your condition. °· Will get help if you are not doing well or get worse. °Document  Released: 12/25/2003 Document Revised: 07/29/2011 Document Reviewed: 05/19/2011 °ExitCare® Patient Information ©2014 [employer]. ° °

## 2013-05-25 NOTE — Progress Notes (Signed)
Grace Nolan, 38 year old female who presents for evaluation of abdominal pain. The pain is described as pressure-like, and is 6/10 in intensity. Pain is located in the deep pelvis area without radiation. Onset was gradual occurring 3 weeks ago. Symptoms have been unchanged since. Aggravating factors: bowel movement, sitting up and urination and painful intercourse. Alleviating factors: flatus. Associated symptoms: diarrhea, flatus, headache, sweats and heartburn. The patient denies anorexia, arthralagias, belching, chills, constipation, dysuria, fever, frequency. Risk factors for pelvic/abdominal pain include none.  Menstrual History: OB History   Grav Para Term Preterm Abortions TAB SAB Ect Mult Living   0 0 0 0 0 0 0 0 0 0       Menarche age: [age] Patient's last menstrual period was 05/16/2013.   There are no active problems to display for this patient.  Past Medical History  Diagnosis Date  . Hypertension   . Depression   . Anxiety     Past Surgical History  Procedure Laterality Date  . No past surgeries       (Not in a hospital admission) No Known Allergies  History  Substance Use Topics  . Smoking status: Current Every Day Smoker -- 0.50 packs/day    Types: Cigarettes  . Smokeless tobacco: Never Used  . Alcohol Use: Yes     Comment: social    History reviewed. No pertinent family history.     Review of Systems Constitutional: negative for fatigue and weight loss Respiratory: negative for cough and wheezing Cardiovascular: negative for chest pain, fatigue and palpitations Gastrointestinal: negative for abdominal pain and change in bowel habits Genitourinary:positive for pelvic pressure with urination Integument/breast: negative for nipple discharge Musculoskeletal:negative for myalgias Neurological: negative for gait problems and tremors Behavioral/Psych: negative for abusive relationship, depression Endocrine: negative for temperature intolerance         Objective:    BP 116/74  Pulse 68  Temp(Src) 97.8 F (36.6 C) (Oral)  Ht 5\' 5"  (1.651 m)  Wt 74.39 kg (164 lb)  BMI 27.29 kg/m2  LMP 05/16/2013 General:   alert  Skin:   no rash or abnormalities  Lungs:   clear to auscultation bilaterally  Heart:   regular rate and rhythm, S1, S2 normal, no murmur, click, rub or gallop  Breasts:   normal without suspicious masses, skin or nipple changes or axillary nodes  Abdomen:  normal findings: no organomegaly, soft, non-tender and no hernia  Pelvis:  External genitalia: normal general appearance Urinary system: urethral meatus normal and bladder without fullness, nontender Vaginal: normal without tenderness, induration or masses Cervix: normal appearance Adnexa: normal bimanual exam Uterus: anteverted and non-tender, normal size Levator tenderness/tone: NT/normal Straight leg raising test: negative     Pelvic pain assessment form reviewed Assessment:    Subacute pelvic pain/pressure--?etiology   Plan:       Pelvic U/S ordered  Possible management options include:NSAIDS prn Follow up as needed

## 2013-05-26 LAB — URINALYSIS, ROUTINE W REFLEX MICROSCOPIC
BILIRUBIN URINE: NEGATIVE
Glucose, UA: NEGATIVE mg/dL
Hgb urine dipstick: NEGATIVE
KETONES UR: NEGATIVE mg/dL
Leukocytes, UA: NEGATIVE
Nitrite: NEGATIVE
PROTEIN: NEGATIVE mg/dL
Specific Gravity, Urine: 1.018 (ref 1.005–1.030)
Urobilinogen, UA: 0.2 mg/dL (ref 0.0–1.0)
pH: 6.5 (ref 5.0–8.0)

## 2013-05-26 LAB — GC/CHLAMYDIA PROBE AMP
CT Probe RNA: NEGATIVE
GC PROBE AMP APTIMA: NEGATIVE

## 2013-05-26 LAB — WET PREP BY MOLECULAR PROBE
Candida species: NEGATIVE
Gardnerella vaginalis: POSITIVE — AB
Trichomonas vaginosis: NEGATIVE

## 2013-05-30 ENCOUNTER — Encounter: Payer: Self-pay | Admitting: Obstetrics & Gynecology

## 2013-05-31 ENCOUNTER — Other Ambulatory Visit: Payer: Self-pay | Admitting: *Deleted

## 2013-05-31 DIAGNOSIS — N949 Unspecified condition associated with female genital organs and menstrual cycle: Secondary | ICD-10-CM

## 2013-05-31 DIAGNOSIS — N760 Acute vaginitis: Principal | ICD-10-CM

## 2013-05-31 DIAGNOSIS — B9689 Other specified bacterial agents as the cause of diseases classified elsewhere: Secondary | ICD-10-CM

## 2013-05-31 MED ORDER — METRONIDAZOLE 500 MG PO TABS: 500.0000 mg | ORAL_TABLET | Freq: Two times a day (BID) | ORAL | Status: DC

## 2013-06-01 ENCOUNTER — Ambulatory Visit: Payer: Managed Care, Other (non HMO) | Admitting: Obstetrics & Gynecology

## 2013-06-01 ENCOUNTER — Ambulatory Visit: Payer: Managed Care, Other (non HMO)

## 2013-06-02 ENCOUNTER — Ambulatory Visit: Payer: Managed Care, Other (non HMO)

## 2013-06-03 ENCOUNTER — Ambulatory Visit: Payer: Managed Care, Other (non HMO) | Admitting: Obstetrics & Gynecology

## 2013-06-15 ENCOUNTER — Encounter: Payer: Self-pay | Admitting: Obstetrics & Gynecology

## 2013-06-23 IMAGING — CR DG CHEST 2V
2 series · 2 of 2 positions shown · non-contrast
Comparison: None.

CLINICAL DATA: Left chest pain

CHEST - 2 VIEW

[w chest pa]
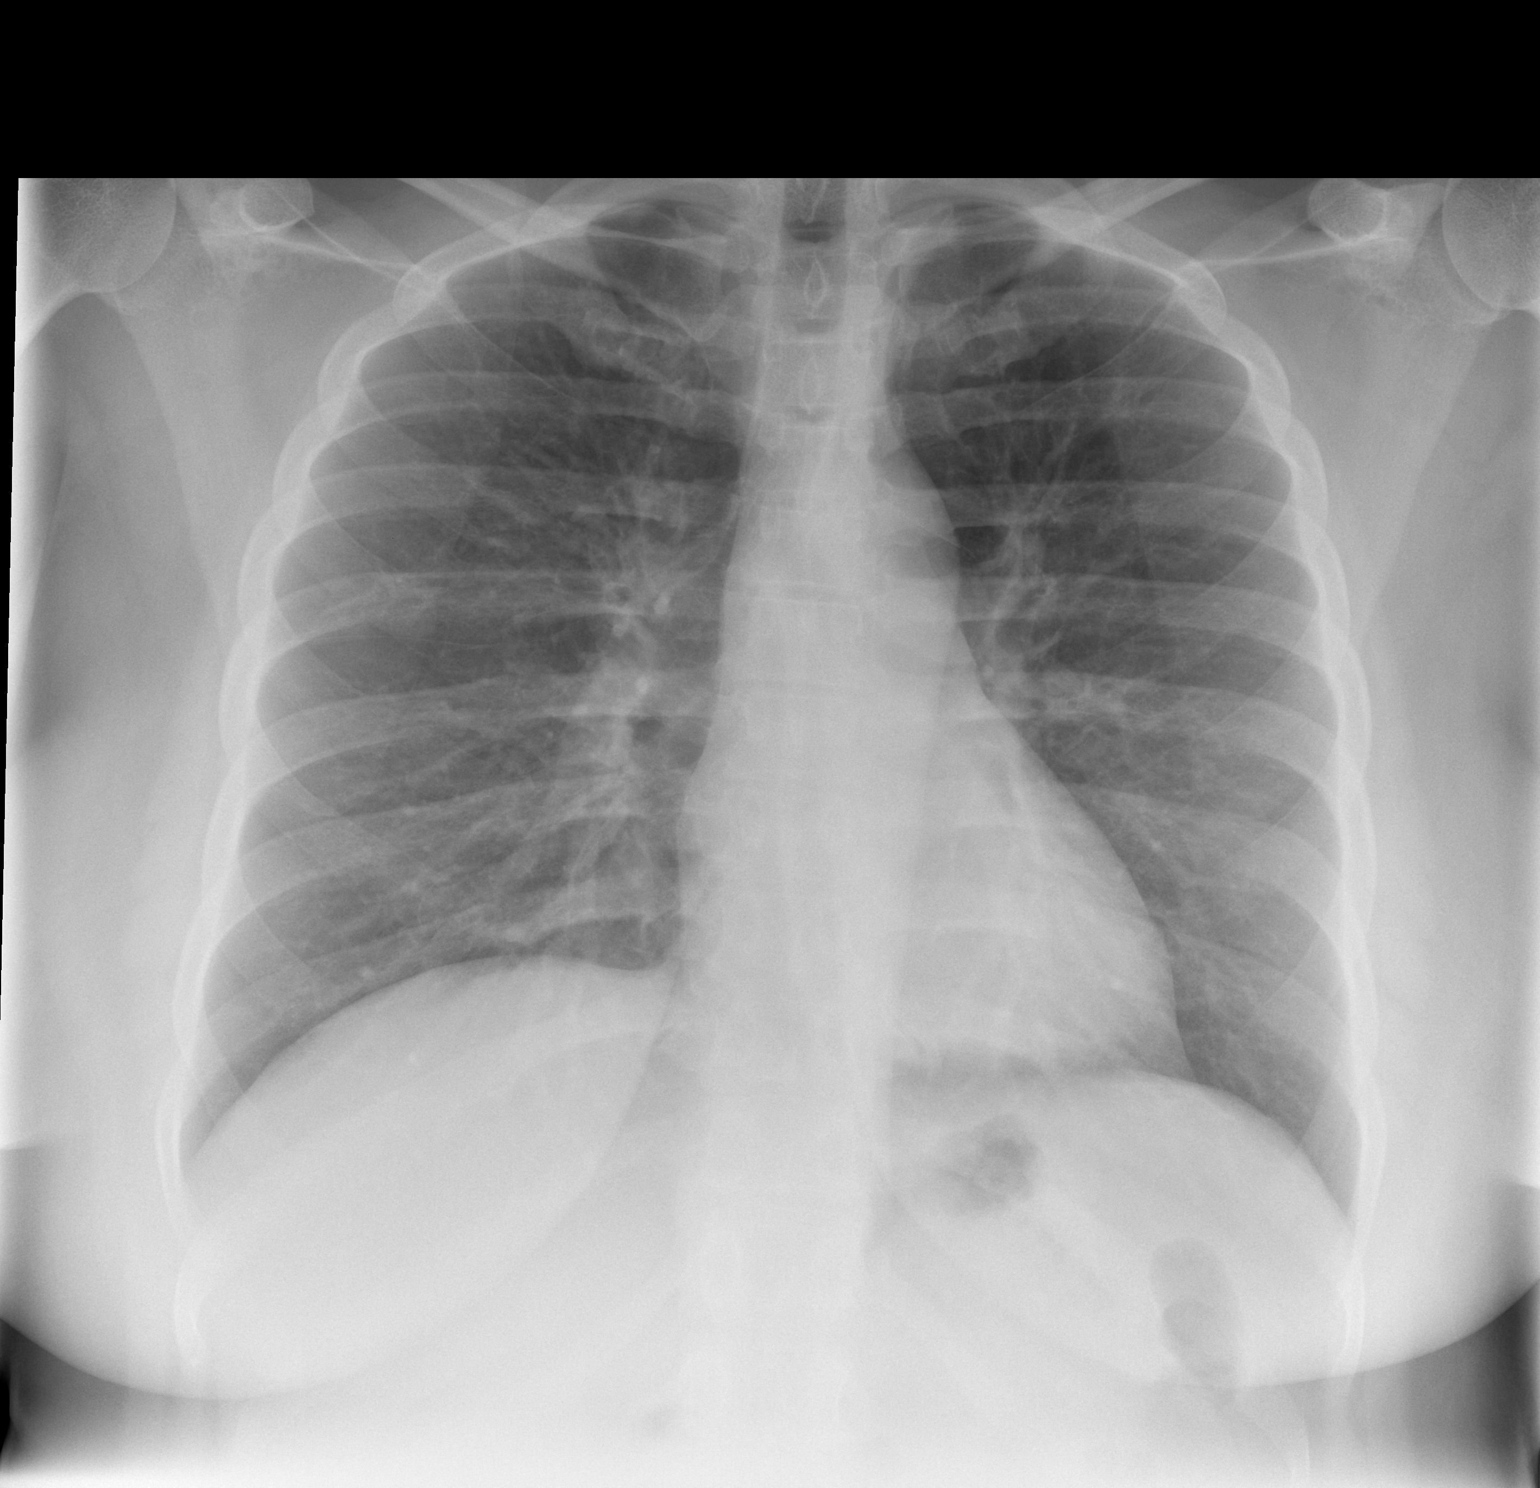

[w chest lat]
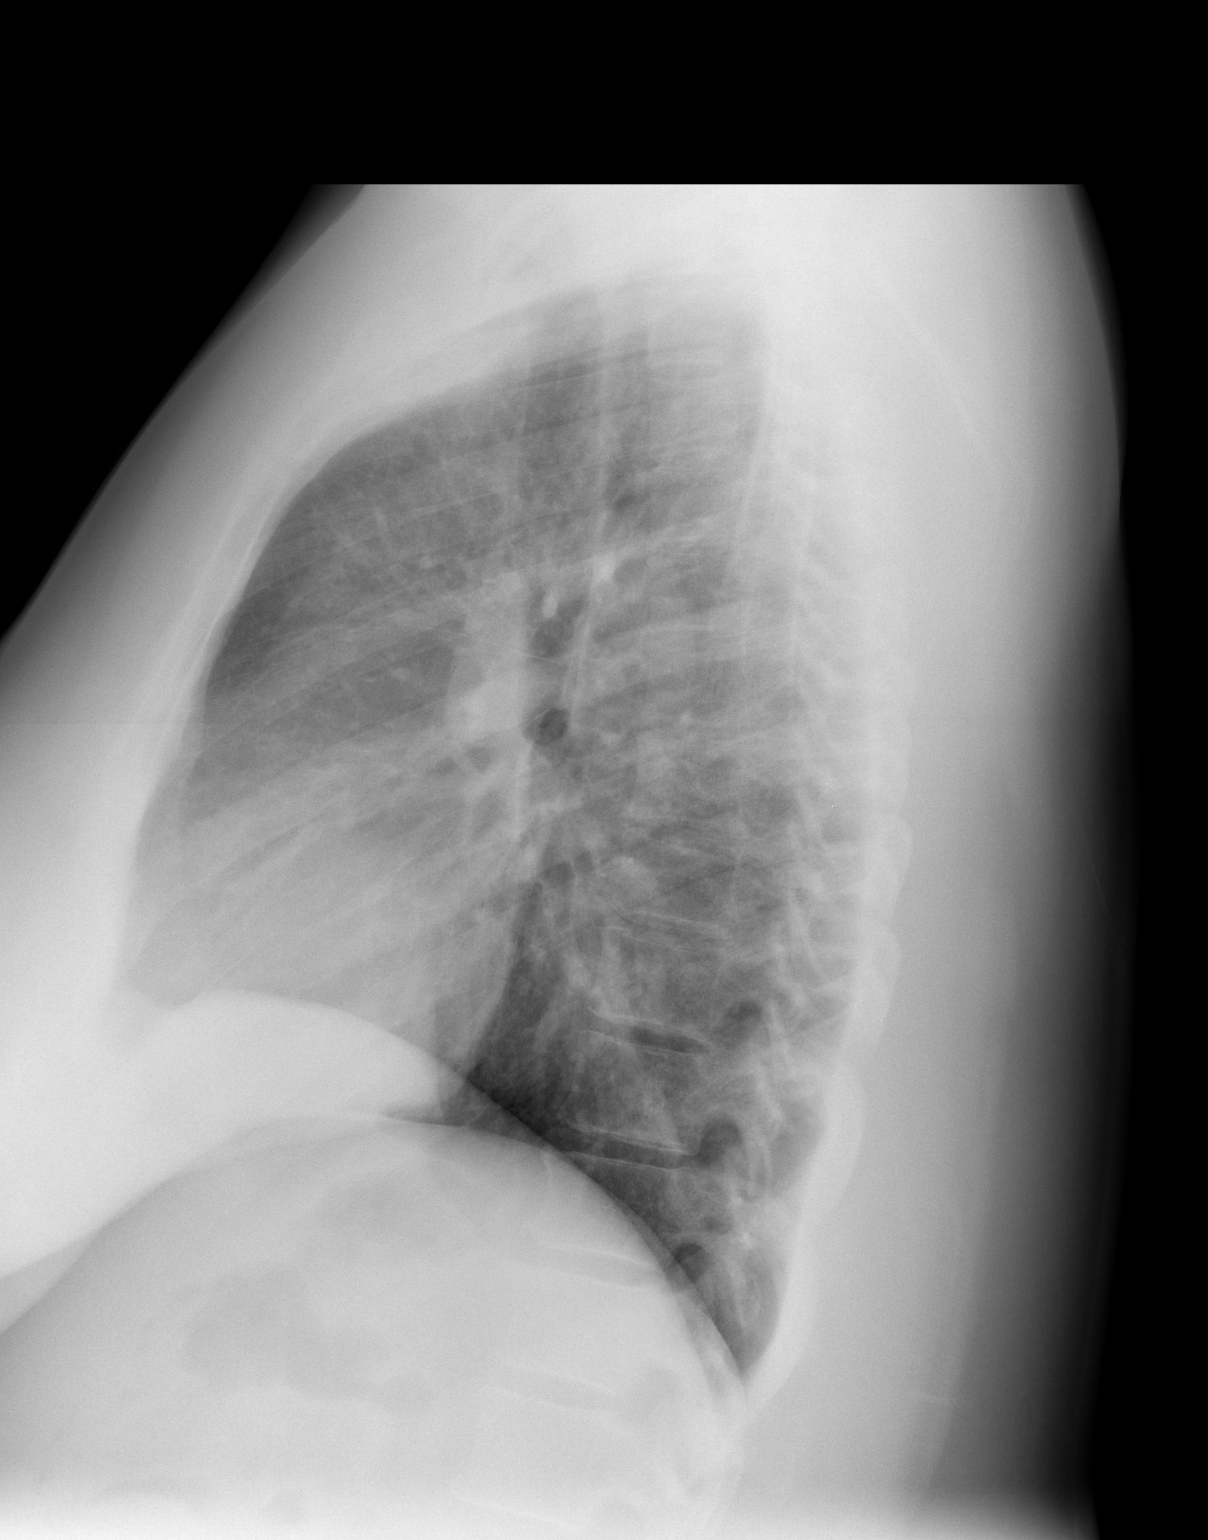

[2 of 2 positions shown; findings below may reference images not displayed]

FINDINGS: Lungs are clear. No pleural effusion or pneumothorax.

Cardiomediastinal silhouette is within normal limits.

Visualized osseous structures are within normal limits.
IMPRESSION: Normal chest radiographs.

## 2013-06-27 ENCOUNTER — Other Ambulatory Visit: Payer: Self-pay | Admitting: *Deleted

## 2013-06-27 DIAGNOSIS — R102 Pelvic and perineal pain: Secondary | ICD-10-CM

## 2013-06-29 ENCOUNTER — Other Ambulatory Visit: Payer: Managed Care, Other (non HMO)

## 2013-09-29 ENCOUNTER — Ambulatory Visit (HOSPITAL_COMMUNITY): Payer: Self-pay | Admitting: Psychiatry

## 2013-11-07 ENCOUNTER — Encounter (HOSPITAL_COMMUNITY): Payer: Self-pay | Admitting: Psychiatry

## 2013-11-07 ENCOUNTER — Ambulatory Visit (INDEPENDENT_AMBULATORY_CARE_PROVIDER_SITE_OTHER): Payer: Managed Care, Other (non HMO) | Admitting: Psychiatry

## 2013-11-07 VITALS — BP 120/89 | HR 76 | Ht 70.0 in | Wt 250.4 lb

## 2013-11-07 DIAGNOSIS — F321 Major depressive disorder, single episode, moderate: Secondary | ICD-10-CM

## 2013-11-07 DIAGNOSIS — F1010 Alcohol abuse, uncomplicated: Secondary | ICD-10-CM

## 2013-11-07 DIAGNOSIS — F339 Major depressive disorder, recurrent, unspecified: Secondary | ICD-10-CM

## 2013-11-07 MED ORDER — ESCITALOPRAM OXALATE 10 MG PO TABS: 10.0000 mg | ORAL_TABLET | Freq: Every day | ORAL | Status: DC

## 2013-11-07 MED ORDER — CLONAZEPAM 0.5 MG PO TABS: 0.5000 mg | ORAL_TABLET | Freq: Every day | ORAL | Status: DC
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
## 2013-11-14 ENCOUNTER — Telehealth (HOSPITAL_COMMUNITY): Payer: Self-pay | Admitting: Psychiatry

## 2013-11-14 NOTE — Telephone Encounter (Signed)
D:  Patient is calling inquiring if information has [facility].  A:  Patient will be starting [facility]-IOP on 11-23-13.  Length of stay is two to three weeks.  Will [facility] chart [facility].

## 2013-11-21 ENCOUNTER — Encounter (HOSPITAL_COMMUNITY): Payer: Self-pay

## 2013-11-21 ENCOUNTER — Telehealth (HOSPITAL_COMMUNITY): Payer: Self-pay

## 2013-11-23 ENCOUNTER — Other Ambulatory Visit (HOSPITAL_COMMUNITY): Payer: Managed Care, Other (non HMO) | Attending: Psychiatry | Admitting: Psychiatry

## 2013-11-23 ENCOUNTER — Encounter (HOSPITAL_COMMUNITY): Payer: Self-pay

## 2013-11-23 DIAGNOSIS — Z62810 Personal history of physical and sexual abuse in childhood: Secondary | ICD-10-CM | POA: Insufficient documentation

## 2013-11-23 DIAGNOSIS — F411 Generalized anxiety disorder: Secondary | ICD-10-CM | POA: Diagnosis not present

## 2013-11-23 DIAGNOSIS — F4310 Post-traumatic stress disorder, unspecified: Secondary | ICD-10-CM

## 2013-11-23 DIAGNOSIS — F17210 Nicotine dependence, cigarettes, uncomplicated: Secondary | ICD-10-CM | POA: Diagnosis not present

## 2013-11-23 DIAGNOSIS — Z609 Problem related to social environment, unspecified: Secondary | ICD-10-CM | POA: Insufficient documentation

## 2013-11-23 DIAGNOSIS — I10 Essential (primary) hypertension: Secondary | ICD-10-CM | POA: Insufficient documentation

## 2013-11-23 DIAGNOSIS — F332 Major depressive disorder, recurrent severe without psychotic features: Secondary | ICD-10-CM | POA: Diagnosis not present

## 2013-11-23 DIAGNOSIS — F331 Major depressive disorder, recurrent, moderate: Secondary | ICD-10-CM

## 2013-11-23 DIAGNOSIS — F410 Panic disorder [episodic paroxysmal anxiety] without agoraphobia: Secondary | ICD-10-CM

## 2013-11-23 MED ORDER — CHLORPROMAZINE HCL 25 MG PO TABS: 25.0000 mg | ORAL_TABLET | Freq: Every day | ORAL | Status: DC

## 2013-11-23 MED ORDER — ALPRAZOLAM 0.5 MG PO TABS: 0.5000 mg | ORAL_TABLET | Freq: Every day | ORAL | Status: DC

## 2013-11-23 MED ORDER — BUPROPION HCL ER (SR) 100 MG PO TB12: 100.0000 mg | ORAL_TABLET | Freq: Two times a day (BID) | ORAL | Status: DC
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
## 2013-11-24 ENCOUNTER — Other Ambulatory Visit (HOSPITAL_COMMUNITY): Payer: Managed Care, Other (non HMO) | Admitting: Psychiatry

## 2013-11-24 NOTE — Progress Notes (Signed)
    Daily Group Progress Note  Program: IOP  Group Time: 9:00-10:30  Participation Level: Active  Behavioral Response: Appropriate  Type of Therapy:  Group Therapy  Summary of Progress: Pt. Met with case manager and psychiatrist.      Group Time: 10:30-12:00  Participation Level:  Active  Behavioral Response: Appropriate  Type of Therapy: Psycho-education Group  Summary of Progress: Pt. Participated in discussion about developing healthy self-care routine.   Nancie Neas, COUNS

## 2013-11-25 ENCOUNTER — Other Ambulatory Visit (HOSPITAL_COMMUNITY): Payer: Managed Care, Other (non HMO) | Admitting: Psychiatry
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
## 2013-11-28 ENCOUNTER — Other Ambulatory Visit (HOSPITAL_COMMUNITY): Payer: Managed Care, Other (non HMO)

## 2013-11-29 ENCOUNTER — Other Ambulatory Visit (HOSPITAL_COMMUNITY): Payer: Managed Care, Other (non HMO) | Admitting: Psychiatry

## 2013-11-30 ENCOUNTER — Other Ambulatory Visit (HOSPITAL_COMMUNITY): Payer: Managed Care, Other (non HMO) | Admitting: Psychiatry

## 2013-12-01 ENCOUNTER — Other Ambulatory Visit (HOSPITAL_COMMUNITY): Payer: Managed Care, Other (non HMO) | Admitting: Psychiatry

## 2013-12-01 DIAGNOSIS — F331 Major depressive disorder, recurrent, moderate: Secondary | ICD-10-CM

## 2013-12-01 DIAGNOSIS — F332 Major depressive disorder, recurrent severe without psychotic features: Secondary | ICD-10-CM | POA: Diagnosis not present

## 2013-12-01 DIAGNOSIS — F410 Panic disorder [episodic paroxysmal anxiety] without agoraphobia: Secondary | ICD-10-CM

## 2013-12-02 ENCOUNTER — Other Ambulatory Visit (HOSPITAL_COMMUNITY): Payer: Managed Care, Other (non HMO)

## 2013-12-02 NOTE — Progress Notes (Signed)
    Daily Group Progress Note  Program: IOP  Group Time: 9:00-10:30  Participation Level: Active  Behavioral Response: Appropriate  Type of Therapy:  Group Therapy  Summary of Progress: Pt. Discussed the embarrassment that she feels about being in a depression group, difficulty of accepting her diagnosis, and pain of not being able to be open with her family who do not understand depression.      Group Time: 10:30-12:00  Participation Level:  Active  Behavioral Response: Appropriate  Type of Therapy: Psycho-education Group  Summary of Progress: Pt. Participated in discussion about developing patience and acceptance of self.   Shaune PollackBrown, Brittnee Gaetano B, LPC

## 2013-12-05 ENCOUNTER — Other Ambulatory Visit (HOSPITAL_COMMUNITY): Payer: Managed Care, Other (non HMO)

## 2013-12-06 ENCOUNTER — Other Ambulatory Visit (HOSPITAL_COMMUNITY): Payer: Managed Care, Other (non HMO)

## 2013-12-06 NOTE — Telephone Encounter (Signed)
Pt phoned and stated that the disability company denied her claim.  "They said that they didn't get enough information.  They did approve the eighteen sessions for MH-IOP though, but they don't want to pay me."  Pt states that she is so frustrated because they said she is to report for work on Friday, 12-09-13.  "I am not ready to return, but I need the money."  A:  Encouraged pt to return to MH-IOP on 12-08-13 in order to see Dr. Rutherford Limerickadepalli.  R:  Pt receptive.

## 2013-12-07 ENCOUNTER — Other Ambulatory Visit (HOSPITAL_COMMUNITY): Payer: Managed Care, Other (non HMO)

## 2013-12-08 ENCOUNTER — Other Ambulatory Visit (HOSPITAL_COMMUNITY): Payer: Managed Care, Other (non HMO)

## 2013-12-09 ENCOUNTER — Other Ambulatory Visit (HOSPITAL_COMMUNITY): Payer: Managed Care, Other (non HMO)
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
## 2013-12-09 NOTE — Patient Instructions (Signed)
Pt discharged due to non-compliancy with attendance.  F/U with Dr. Lolly MustacheArfeen and therapist of choice.  Encouraged support groups.  RTW today.

## 2013-12-12 ENCOUNTER — Encounter (HOSPITAL_COMMUNITY): Payer: Self-pay

## 2013-12-12 ENCOUNTER — Other Ambulatory Visit (HOSPITAL_COMMUNITY): Payer: Managed Care, Other (non HMO) | Attending: Psychiatry

## 2013-12-13 ENCOUNTER — Other Ambulatory Visit (HOSPITAL_COMMUNITY): Payer: Managed Care, Other (non HMO)

## 2013-12-14 ENCOUNTER — Other Ambulatory Visit (HOSPITAL_COMMUNITY): Payer: Managed Care, Other (non HMO)

## 2013-12-15 ENCOUNTER — Other Ambulatory Visit (HOSPITAL_COMMUNITY): Payer: Managed Care, Other (non HMO)

## 2013-12-16 ENCOUNTER — Other Ambulatory Visit (HOSPITAL_COMMUNITY): Payer: Managed Care, Other (non HMO)

## 2013-12-19 ENCOUNTER — Other Ambulatory Visit (HOSPITAL_COMMUNITY): Payer: Managed Care, Other (non HMO)

## 2013-12-20 ENCOUNTER — Other Ambulatory Visit (HOSPITAL_COMMUNITY): Payer: Managed Care, Other (non HMO)

## 2013-12-21 ENCOUNTER — Other Ambulatory Visit (HOSPITAL_COMMUNITY): Payer: Managed Care, Other (non HMO)

## 2013-12-22 ENCOUNTER — Other Ambulatory Visit (HOSPITAL_COMMUNITY): Payer: Managed Care, Other (non HMO)

## 2013-12-23 ENCOUNTER — Other Ambulatory Visit (HOSPITAL_COMMUNITY): Payer: Managed Care, Other (non HMO)

## 2013-12-26 ENCOUNTER — Other Ambulatory Visit (HOSPITAL_COMMUNITY): Payer: Managed Care, Other (non HMO)

## 2013-12-27 ENCOUNTER — Other Ambulatory Visit (HOSPITAL_COMMUNITY): Payer: Managed Care, Other (non HMO)

## 2013-12-28 ENCOUNTER — Ambulatory Visit (HOSPITAL_COMMUNITY): Payer: Self-pay | Admitting: Psychiatry

## 2013-12-28 ENCOUNTER — Other Ambulatory Visit (HOSPITAL_COMMUNITY): Payer: Managed Care, Other (non HMO)

## 2013-12-29 ENCOUNTER — Other Ambulatory Visit (HOSPITAL_COMMUNITY): Payer: Managed Care, Other (non HMO)

## 2013-12-30 ENCOUNTER — Other Ambulatory Visit (HOSPITAL_COMMUNITY): Payer: Managed Care, Other (non HMO)

## 2014-01-02 ENCOUNTER — Other Ambulatory Visit (HOSPITAL_COMMUNITY): Payer: Managed Care, Other (non HMO)

## 2014-01-03 ENCOUNTER — Other Ambulatory Visit (HOSPITAL_COMMUNITY): Payer: Managed Care, Other (non HMO)

## 2014-01-04 ENCOUNTER — Other Ambulatory Visit (HOSPITAL_COMMUNITY): Payer: Managed Care, Other (non HMO)

## 2014-01-04 ENCOUNTER — Ambulatory Visit (HOSPITAL_COMMUNITY): Payer: Self-pay | Admitting: Psychiatry

## 2014-02-06 ENCOUNTER — Encounter: Payer: Self-pay | Admitting: *Deleted

## 2014-02-07 ENCOUNTER — Encounter: Payer: Self-pay | Admitting: Obstetrics & Gynecology

## 2014-02-22 ENCOUNTER — Encounter (HOSPITAL_COMMUNITY): Payer: Self-pay | Admitting: Psychiatry

## 2014-02-22 ENCOUNTER — Ambulatory Visit (INDEPENDENT_AMBULATORY_CARE_PROVIDER_SITE_OTHER): Payer: BLUE CROSS/BLUE SHIELD | Admitting: Psychiatry

## 2014-02-22 VITALS — BP 126/85 | HR 84 | Ht 70.0 in | Wt 248.4 lb

## 2014-02-22 DIAGNOSIS — F1010 Alcohol abuse, uncomplicated: Secondary | ICD-10-CM

## 2014-02-22 DIAGNOSIS — F331 Major depressive disorder, recurrent, moderate: Secondary | ICD-10-CM

## 2014-02-22 MED ORDER — ALPRAZOLAM 0.5 MG PO TABS: 0.5000 mg | ORAL_TABLET | Freq: Every day | ORAL | Status: DC

## 2014-02-22 MED ORDER — BUPROPION HCL ER (XL) 150 MG PO TB24: 150.0000 mg | ORAL_TABLET | ORAL | Status: DC
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
## 2014-02-28 ENCOUNTER — Telehealth (HOSPITAL_COMMUNITY): Payer: Self-pay

## 2014-02-28 NOTE — Telephone Encounter (Signed)
Discussed with Dr. Lolly MustacheArfeen received [employer] disability papers for patient.  Patient did not discuss with Dr. Lolly MustacheArfeen at last evaluation so will hold papers for review with Dr. Lolly MustacheArfeen at evaluation scheduled 03/15/14 per direction by Dr. Lolly MustacheArfeen.

## 2014-03-08 ENCOUNTER — Ambulatory Visit (HOSPITAL_COMMUNITY): Payer: Self-pay | Admitting: Clinical

## 2014-03-15 ENCOUNTER — Ambulatory Visit (INDEPENDENT_AMBULATORY_CARE_PROVIDER_SITE_OTHER): Payer: BLUE CROSS/BLUE SHIELD | Admitting: Psychiatry

## 2014-03-15 ENCOUNTER — Encounter (HOSPITAL_COMMUNITY): Payer: Self-pay | Admitting: Psychiatry

## 2014-03-15 VITALS — BP 121/78 | HR 80 | Ht 70.0 in | Wt 250.0 lb

## 2014-03-15 DIAGNOSIS — F1010 Alcohol abuse, uncomplicated: Secondary | ICD-10-CM

## 2014-03-15 DIAGNOSIS — F339 Major depressive disorder, recurrent, unspecified: Secondary | ICD-10-CM | POA: Diagnosis not present

## 2014-03-15 DIAGNOSIS — F331 Major depressive disorder, recurrent, moderate: Secondary | ICD-10-CM

## 2014-03-15 MED ORDER — BUPROPION HCL ER (XL) 300 MG PO TB24: 300.0000 mg | ORAL_TABLET | Freq: Every day | ORAL | Status: DC

## 2014-03-15 MED ORDER — ALPRAZOLAM 0.5 MG PO TABS: 0.5000 mg | ORAL_TABLET | Freq: Every day | ORAL | Status: DC
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
## 2014-03-17 ENCOUNTER — Ambulatory Visit (HOSPITAL_COMMUNITY): Payer: Self-pay | Admitting: Clinical

## 2014-04-06 ENCOUNTER — Telehealth (HOSPITAL_COMMUNITY): Payer: Self-pay | Admitting: *Deleted

## 2014-04-06 NOTE — Telephone Encounter (Signed)
Received FMLA--Disability paperwork via fax.  Per Dr. Sheela StackArfeen's office note on 03-15-14, patient will need to get disability forms filled out by her therapist, Adelene AmasLinda Makinson, note is below.  Plan:  Patient is doing better from the past. She like the Wellbutrin. She has no side effects including any tremors or shakes. I would increase the dose to 300 mg daily. She is taking Xanax 0.5 mg only as needed. She has cut down her drinking from the past. I encouraged to keep her appointment with her therapist Adelene AmasLinda Makinson for counseling. Patient is scheduled to go back to work on February 11th. Her disability papers are filled by her therapist. Recommended to call us back if she has any question, concern or if she feels worsening of the symptom. Follow-up in 2 months. Time spent 25 minutes. More than 50% of the time spent in psychoeducation, counseling and coordination of care. Discuss safety plan that anytime having active suicidal thoughts or homicidal thoughts then patient need to call 911 or go to the local emergency room.  I tried to contact patient twice but goes straight to voicmail and recording stated voicemail has not been set up yet cannot leave a message.   Will put patient's FMLA information in our hold folder.  I also contacted Melissa Pullin, who is handling the FMLA claim at [phone]. Had to leave a message for a call back.

## 2014-04-07 ENCOUNTER — Telehealth (HOSPITAL_COMMUNITY): Payer: Self-pay

## 2014-04-07 NOTE — Telephone Encounter (Signed)
Telephone call with Grace Nolan from [employer] who reported they had not received any further information on patient since she was taken out of work and had down that she was to return on 03/23/14.  Collateral reported she planned to follow up with patient's therapist for current needed paperwork but was not under the impression from patient she was following up with therapy currently.

## 2014-05-17 ENCOUNTER — Ambulatory Visit (HOSPITAL_COMMUNITY): Payer: Self-pay | Admitting: Psychiatry

## 2014-05-24 ENCOUNTER — Ambulatory Visit (HOSPITAL_COMMUNITY): Payer: Self-pay | Admitting: Psychiatry

## 2014-05-24 ENCOUNTER — Other Ambulatory Visit (HOSPITAL_COMMUNITY): Payer: Self-pay | Admitting: *Deleted

## 2014-05-24 DIAGNOSIS — F331 Major depressive disorder, recurrent, moderate: Secondary | ICD-10-CM

## 2014-05-24 NOTE — Telephone Encounter (Signed)
Pt called requesting refill of medications. Pt was no show to appt on 4/6, and has cancelled the appt scheduled for today 4/13. Pt states her car is in shop and is unable to make appt. Pt was last seen on 03/15/2014. Please advise.

## 2014-05-26 ENCOUNTER — Other Ambulatory Visit (HOSPITAL_COMMUNITY): Payer: Self-pay

## 2014-05-26 DIAGNOSIS — F331 Major depressive disorder, recurrent, moderate: Secondary | ICD-10-CM

## 2014-05-26 NOTE — Telephone Encounter (Signed)
Medication refill - Would like a refill of Xanax but not Wellbutrin. States missed appts. 05/24/14 and 05/17/14 due to loss of her Mother 2 weeks prior. States will make an appt. on 05/29/14 once she returns to work and knows her schedule. Patient reported thinks she needs to return to a lower dosage of Wellbutrin but reports will discuss with Dr. Lolly MustacheArfeen once she comes back in for an appointment.  Informed patient this nurse would send request to Dr. Lolly MustacheArfeen but could not guarantee he would be willing to fill Xanax without being seen or an appointment scheduled.  Patient agreed to call back on Monday 05/29/14 to schedule first available.  No suicidal or homicidal ideations at this time and patient stated understanding of plan.

## 2014-06-01 NOTE — Telephone Encounter (Signed)
Medication refills - Patient requests refill of Alprazolam and wants to return to Wellbutrin 150mg  q day.  Patient states back at work now and has rescheduled with Dr. Lolly MustacheArfeen for 06/15/14 but reports in need of both medications due to increased anxiousness.  Patient requests new orders be sent in or [facility].  Informed patient this nurse would send request to Dr. Lolly MustacheArfeen but could not guarantee both would be filled as requested due to her past 2 missed appointments on 05/17/14 and 05/24/14 as patient stated again this was due to the loss of her Mother at that time.

## 2014-06-02 ENCOUNTER — Other Ambulatory Visit (HOSPITAL_COMMUNITY): Payer: Self-pay | Admitting: Psychiatry

## 2014-06-02 ENCOUNTER — Telehealth (HOSPITAL_COMMUNITY): Payer: Self-pay

## 2014-06-02 DIAGNOSIS — F331 Major depressive disorder, recurrent, moderate: Secondary | ICD-10-CM

## 2014-06-02 MED ORDER — BUPROPION HCL ER (XL) 150 MG PO TB24: 150.0000 mg | ORAL_TABLET | Freq: Every day | ORAL | Status: DC

## 2014-06-02 MED ORDER — ALPRAZOLAM 0.5 MG PO TABS: 0.5000 mg | ORAL_TABLET | Freq: Every day | ORAL | Status: DC

## 2014-06-02 NOTE — Telephone Encounter (Signed)
Medication management - Patient called and requested a change back to Wellbutrin 121m, one a day.  Patient also stated concern her appointment was moved again from 06/15/14 to 06/29/14 due to Dr. AAdele Schildernot being in the office that day, so new15 day supply of Xanax will not be enough until appointment.  Patient to call back in 2 weeks to request refill of Xanax again at that time. Met with Dr. AAdele Schilderwho authorized patient's return to Wellbutrin 1519m one a day, one time refill.  Patient's new Wellbutrin XL 15086mrder e-scribed to [employer] patient requested.

## 2014-06-02 NOTE — Telephone Encounter (Signed)
Called in one time order of Alprazolam 0.5mg , one at bedtime, #15 [name] on [address].  Left patient a message the order was called in and requested she keep upcoming appointment.

## 2014-06-02 NOTE — Telephone Encounter (Signed)
We will provide 15 tablets of Xanax until she will see On her next appointment.

## 2014-06-15 ENCOUNTER — Ambulatory Visit (HOSPITAL_COMMUNITY): Payer: Self-pay | Admitting: Psychiatry

## 2014-06-19 ENCOUNTER — Other Ambulatory Visit (HOSPITAL_COMMUNITY): Payer: Self-pay

## 2014-06-19 DIAGNOSIS — F331 Major depressive disorder, recurrent, moderate: Secondary | ICD-10-CM

## 2014-06-19 NOTE — Telephone Encounter (Signed)
Telephone call with patient after she left a message requesting a refill of her Xanax medication.  Patient stated she was only given a partial order of 15 tablets when the medicine was filled but now does not have enough until she returns to see Dr. Lolly MustacheArfeen on 06/29/14.  States her appointment from 06/15/14 was moved back since Dr. Lolly MustacheArfeen was not in the office that date.  Requests enough Xanax to at least make it until her appointment now set for 06/29/14.

## 2014-06-20 MED ORDER — ALPRAZOLAM 0.5 MG PO TABS: 0.5000 mg | ORAL_TABLET | Freq: Every day | ORAL | Status: DC

## 2014-06-20 NOTE — Telephone Encounter (Signed)
Met with Dr. Adele Schilder who authorized a refill of patient's prescribed Xanax 0.86m one at bedtime enough until patient returns to see him on 06/29/14, 10 pills.  Called in patient's new authorized order to [facility] with [name], pharmacist.  Called patient to inform a 10 day supply order of her Xanax 0.514mtablet at bedtime was called into her pharmacy and patient agreed to keep her appointment on 06/29/14.

## 2014-06-29 ENCOUNTER — Encounter (HOSPITAL_COMMUNITY): Payer: Self-pay | Admitting: Psychiatry

## 2014-06-29 ENCOUNTER — Ambulatory Visit (INDEPENDENT_AMBULATORY_CARE_PROVIDER_SITE_OTHER): Payer: BLUE CROSS/BLUE SHIELD | Admitting: Psychiatry

## 2014-06-29 VITALS — BP 118/76 | HR 74 | Ht 70.0 in | Wt 243.6 lb

## 2014-06-29 DIAGNOSIS — F1010 Alcohol abuse, uncomplicated: Secondary | ICD-10-CM

## 2014-06-29 DIAGNOSIS — F339 Major depressive disorder, recurrent, unspecified: Secondary | ICD-10-CM

## 2014-06-29 DIAGNOSIS — F411 Generalized anxiety disorder: Secondary | ICD-10-CM | POA: Diagnosis not present

## 2014-06-29 DIAGNOSIS — F410 Panic disorder [episodic paroxysmal anxiety] without agoraphobia: Secondary | ICD-10-CM

## 2014-06-29 DIAGNOSIS — F4310 Post-traumatic stress disorder, unspecified: Secondary | ICD-10-CM

## 2014-06-29 DIAGNOSIS — F331 Major depressive disorder, recurrent, moderate: Secondary | ICD-10-CM

## 2014-06-29 MED ORDER — BUPROPION HCL ER (XL) 150 MG PO TB24: 150.0000 mg | ORAL_TABLET | Freq: Every day | ORAL | Status: DC

## 2014-06-29 MED ORDER — TRAZODONE HCL 50 MG PO TABS: ORAL_TABLET | ORAL | Status: DC

## 2014-06-29 MED ORDER — CLONAZEPAM 0.5 MG PO TABS: 0.5000 mg | ORAL_TABLET | Freq: Every day | ORAL | Status: DC
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
## 2014-07-27 ENCOUNTER — Telehealth (HOSPITAL_COMMUNITY): Payer: Self-pay

## 2014-07-27 DIAGNOSIS — F411 Generalized anxiety disorder: Secondary | ICD-10-CM

## 2014-07-27 DIAGNOSIS — F331 Major depressive disorder, recurrent, moderate: Secondary | ICD-10-CM

## 2014-07-27 NOTE — Telephone Encounter (Signed)
Medication refil - Telephone message left for patient to verify this nurse got the message she had to reschedule her appointment set for 07/28/14 to 08/07/14 and would send a request for a refill of medication to Dr. Lolly Mustache as patient will not have enough until appointment

## 2014-07-28 ENCOUNTER — Ambulatory Visit (HOSPITAL_COMMUNITY): Payer: Self-pay | Admitting: Psychiatry

## 2014-07-28 MED ORDER — BUPROPION HCL ER (XL) 150 MG PO TB24
150.0000 mg | ORAL_TABLET | Freq: Every day | ORAL | Status: DC
Start: 2014-07-28 — End: 2014-08-07

## 2014-07-28 NOTE — Telephone Encounter (Signed)
Met with Dr. Adele Schilder who agreed to refill patient's Wellbutrin but that patient would have to be seen before she would be given a new Klonopin prescription.  New one time refill for patient's Wellbutrin e-scribed to patient's pharmacy Left patient a voice message her Wellbutrin was approved and e-scribed over to her [place] but that patient would have to be seen for more refills of her prescribed Klonopin.  Requested patient call back if any questions.

## 2014-08-07 ENCOUNTER — Encounter (HOSPITAL_COMMUNITY): Payer: Self-pay | Admitting: Psychiatry

## 2014-08-07 ENCOUNTER — Ambulatory Visit (INDEPENDENT_AMBULATORY_CARE_PROVIDER_SITE_OTHER): Payer: BLUE CROSS/BLUE SHIELD | Admitting: Psychiatry

## 2014-08-07 VITALS — BP 136/88 | HR 82 | Ht 68.19 in | Wt 246.0 lb

## 2014-08-07 DIAGNOSIS — F1010 Alcohol abuse, uncomplicated: Secondary | ICD-10-CM | POA: Diagnosis not present

## 2014-08-07 DIAGNOSIS — F339 Major depressive disorder, recurrent, unspecified: Secondary | ICD-10-CM | POA: Diagnosis not present

## 2014-08-07 DIAGNOSIS — F411 Generalized anxiety disorder: Secondary | ICD-10-CM

## 2014-08-07 DIAGNOSIS — F331 Major depressive disorder, recurrent, moderate: Secondary | ICD-10-CM

## 2014-08-07 MED ORDER — BUPROPION HCL ER (XL) 150 MG PO TB24: 150.0000 mg | ORAL_TABLET | Freq: Every day | ORAL | Status: DC

## 2014-08-07 MED ORDER — TRAZODONE HCL 50 MG PO TABS: ORAL_TABLET | ORAL | Status: DC

## 2014-08-07 MED ORDER — CLONAZEPAM 0.5 MG PO TABS: 0.5000 mg | ORAL_TABLET | Freq: Every day | ORAL | Status: AC
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
## 2014-08-09 ENCOUNTER — Telehealth (HOSPITAL_COMMUNITY): Payer: Self-pay | Admitting: Psychiatry

## 2014-08-09 NOTE — Telephone Encounter (Signed)
D:  [facility] [phone]) the number which was on the form patient dropped off to be completed.  According to Gerre PebblesGarrett, no claim is currently open for [facility] at [facility].  Gerre PebblesGarrett states that the capabilities and limitation form is between patient and her HR department.  He suggests that maybe patient's HR dept is requesting pt to have it completed.  A:  Completed form; awaiting signature of a psychiatrist in Dr. Sheela StackArfeen's absence since pt is requesting to have form before tomorrow.  Inform Everlene BallsShawn Taylor, RN (clinical mgr).

## 2014-08-10 ENCOUNTER — Other Ambulatory Visit (HOSPITAL_COMMUNITY): Payer: Self-pay

## 2014-08-10 DIAGNOSIS — F331 Major depressive disorder, recurrent, moderate: Secondary | ICD-10-CM

## 2014-08-10 DIAGNOSIS — F411 Generalized anxiety disorder: Secondary | ICD-10-CM

## 2014-08-10 MED ORDER — TRAZODONE HCL 50 MG PO TABS: ORAL_TABLET | ORAL | Status: AC

## 2014-08-10 MED ORDER — BUPROPION HCL ER (XL) 150 MG PO TB24: 150.0000 mg | ORAL_TABLET | Freq: Every day | ORAL | Status: AC

## 2014-08-10 NOTE — Telephone Encounter (Signed)
D:  Placed call to pt; to inquire about recent appointment with therapist.  According to pt, she saw her on 07-26-14.  Informed pt that Dr. Lolly MustacheArfeen signed the completed capabilities and limitations form.  A:  Faxed form to [employer] (HR) [phone].  Form is recommending pt to be allowed to see the psychiatrist once a month and the therapist once a week (if appointments are available).  Informed Everlene BallsShawn Taylor, RN (clinical mgr).  R:  Pt receptive.

## 2014-08-10 NOTE — Telephone Encounter (Signed)
Called [facility] pharmacy to verify patient had to have 90 day supplies of medication for her insurance to cover orders for Trazodone and Wellbutrin.  Discussed with Dr. Lolly MustacheArfeen who authorized changing orders to 90 day supplies. New orders for patient's prescribed Wellbutrin and Trazodone e-scribed into patient's [facility] for 90 day supplies of both medications as ordered.

## 2014-10-09 ENCOUNTER — Ambulatory Visit (HOSPITAL_COMMUNITY): Payer: Self-pay | Admitting: Psychiatry

## 2014-11-06 ENCOUNTER — Telehealth (HOSPITAL_COMMUNITY): Payer: Self-pay

## 2014-11-06 NOTE — Telephone Encounter (Signed)
11/06/14 Patient called to schedule an appt with Dr. Lolly Mustache - informed the patient that we sent a letter to her but due to the address the letter was returned - pt stated that she had move - informed the patient that she had been discharged from the practice due to "no shows"  The patient didn't want to give the new address and stated "You just told me so no need to send the letter because you guys didn't do shit anyway"  The conversation ended./sh

## 2015-10-01 IMAGING — US US OB TRANSVAGINAL
1 series · 14 of 28 positions shown · non-contrast
Comparison: None.

CLINICAL DATA: Vaginal bleeding and abdominal pain.

EXAM:
OBSTETRIC <14 WK US AND TRANSVAGINAL OB US
TECHNIQUE: Both transabdominal and transvaginal ultrasound examinations were
performed for complete evaluation of the gestation as well as the
maternal uterus, adnexal regions, and pelvic cul-de-sac.
Transvaginal technique was performed to assess early pregnancy.

[Series 1: us ob transvaginal · 0.25mm/px · 14 of 57 slices shown]
[im 3/57]
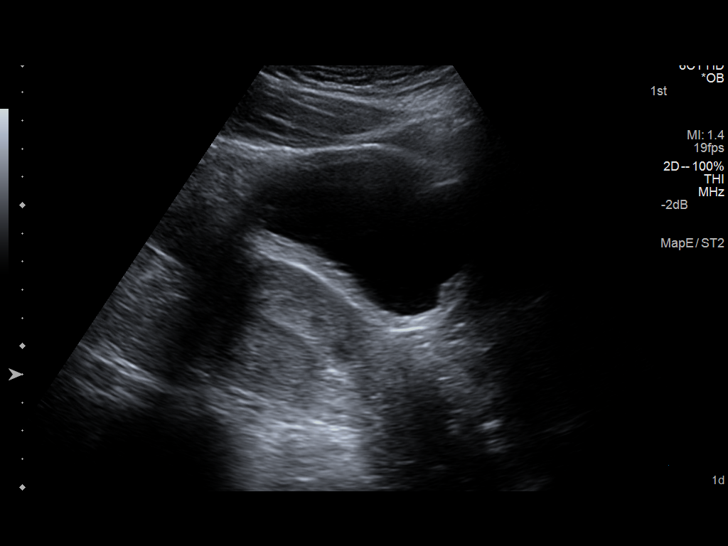
[im 7/57]
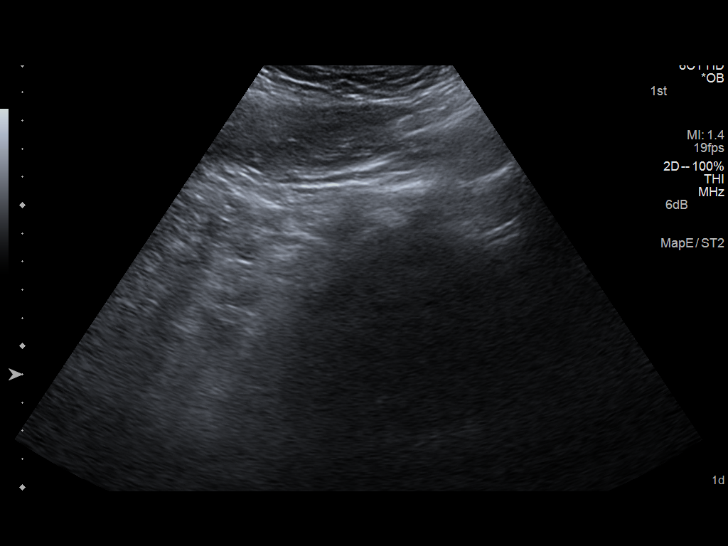
[im 11/57]
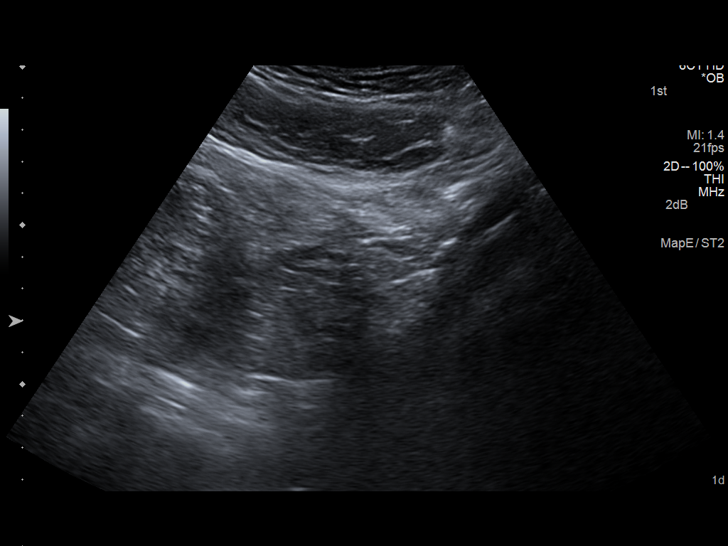
[im 15/57]
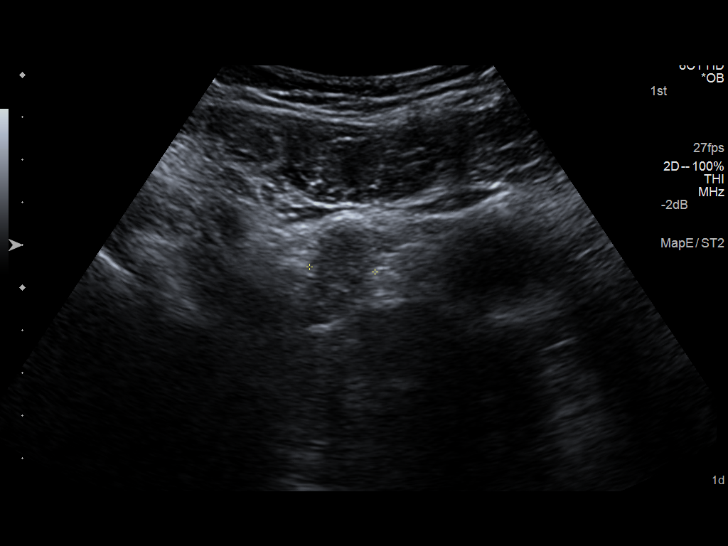
[im 19/57]
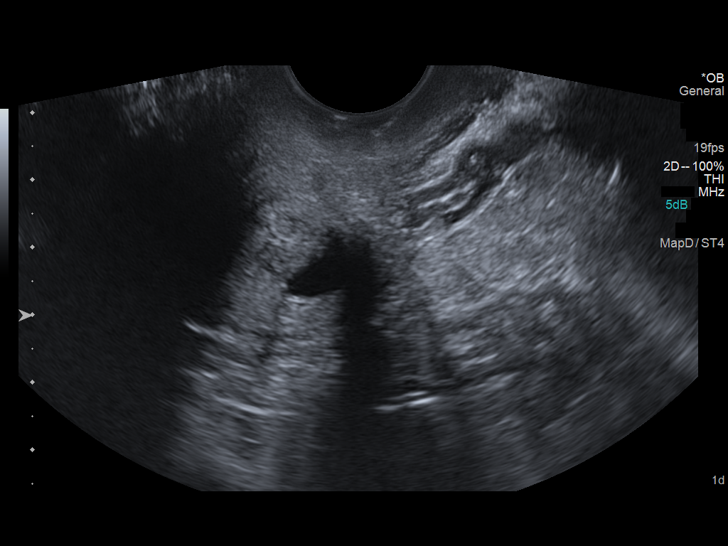
[im 23/57]
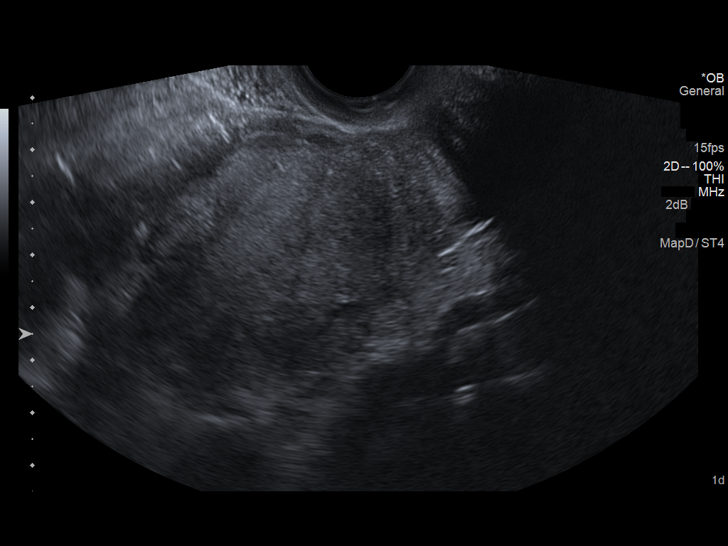
[im 27/57]
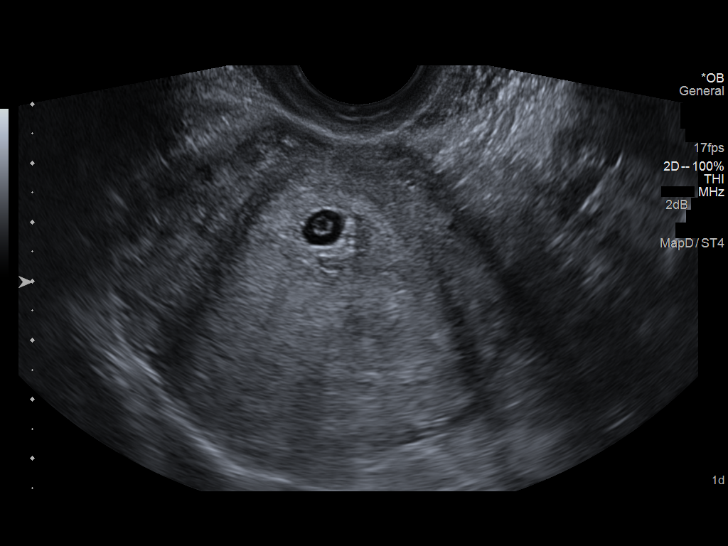
[im 32/57]
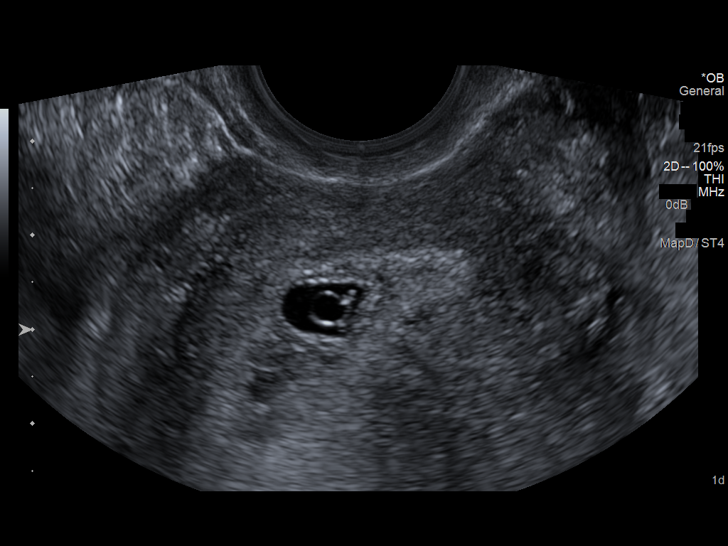
[im 36/57]
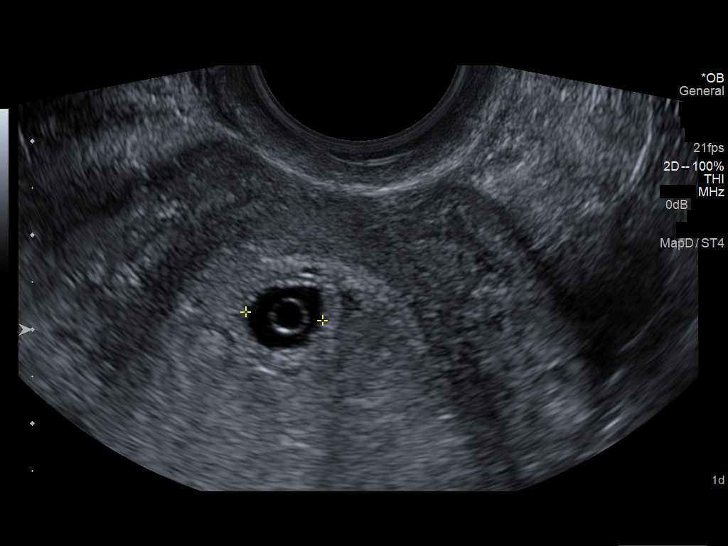
[im 40/57]
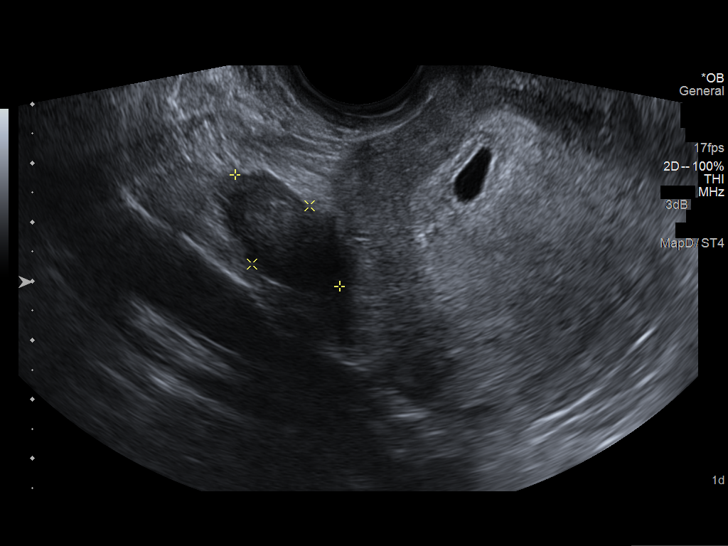
[im 44/57]
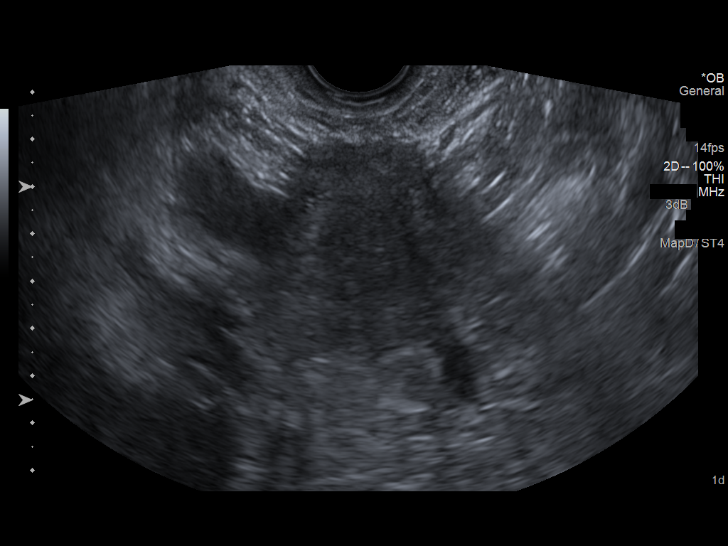
[im 48/57]
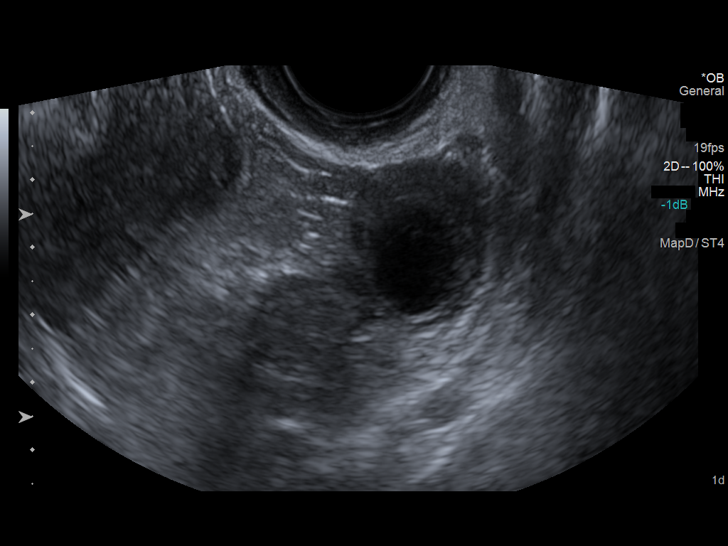
[im 52/57]
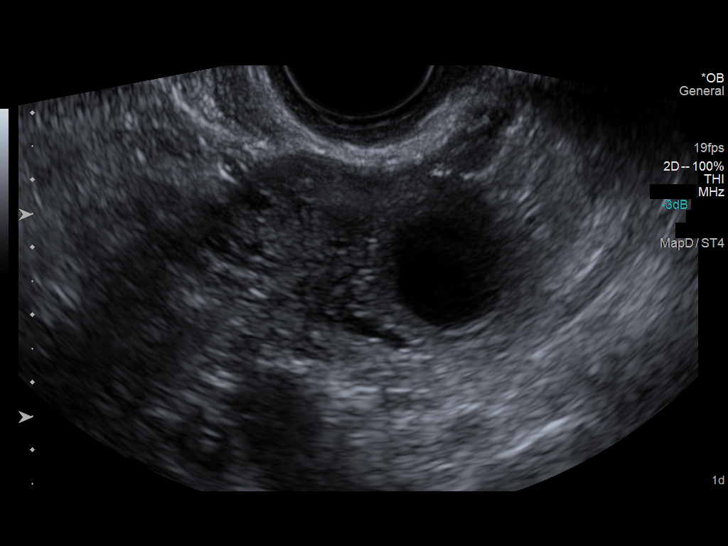
[im 57/57]
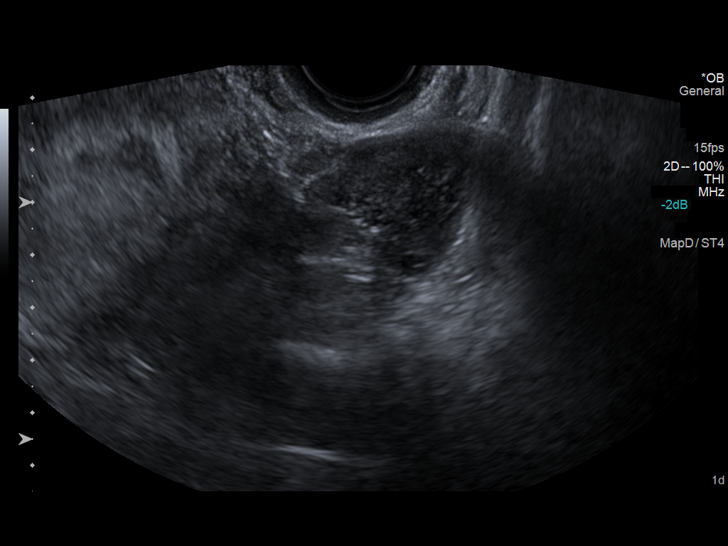

[14 of 28 positions shown; findings below may reference images not displayed]

FINDINGS: Intrauterine gestational sac: Single.

Yolk sac:  Yes

Embryo:  No

Cardiac Activity: No

MSD:  7.4  mm   5 w   3  d

US EDC: 10/16/2013

Maternal uterus/adnexae: Tiny subchorionic hemorrhage. Normal right
ovary. 2.6 cm corpus luteum cyst on the left ovary. No free fluid.
IMPRESSION: Intrauterine gestational sac, 5 weeks 3 days gestation. Possible
tiny subchorionic hemorrhage. No visible embryo at this time.

## 2015-10-01 IMAGING — CR DG CHEST 2V
2 series · 2 of 2 positions shown · non-contrast
Comparison: November 09, 2010

CLINICAL DATA: Pain

EXAM:
CHEST  2 VIEW

[w chest pa]
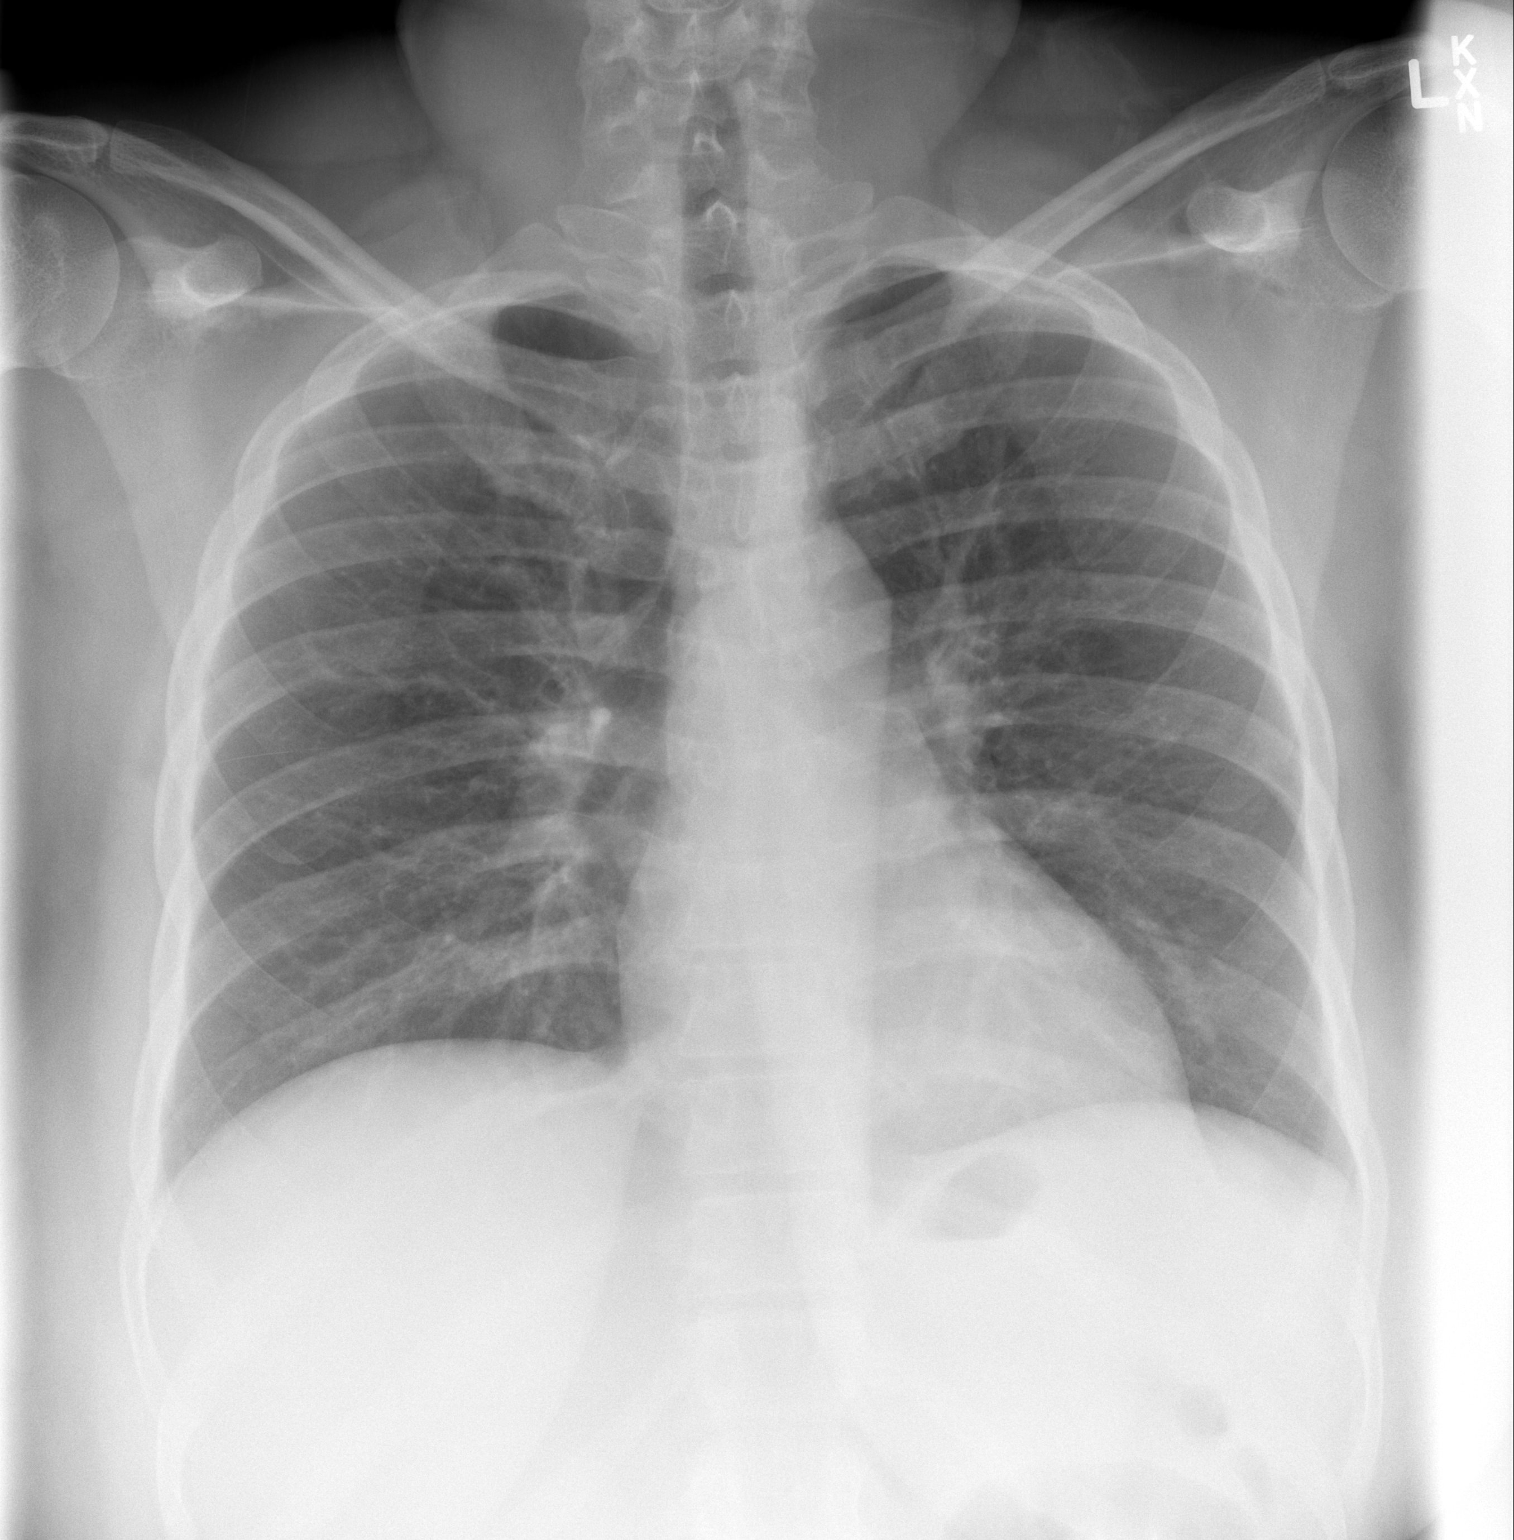

[w chest lat]
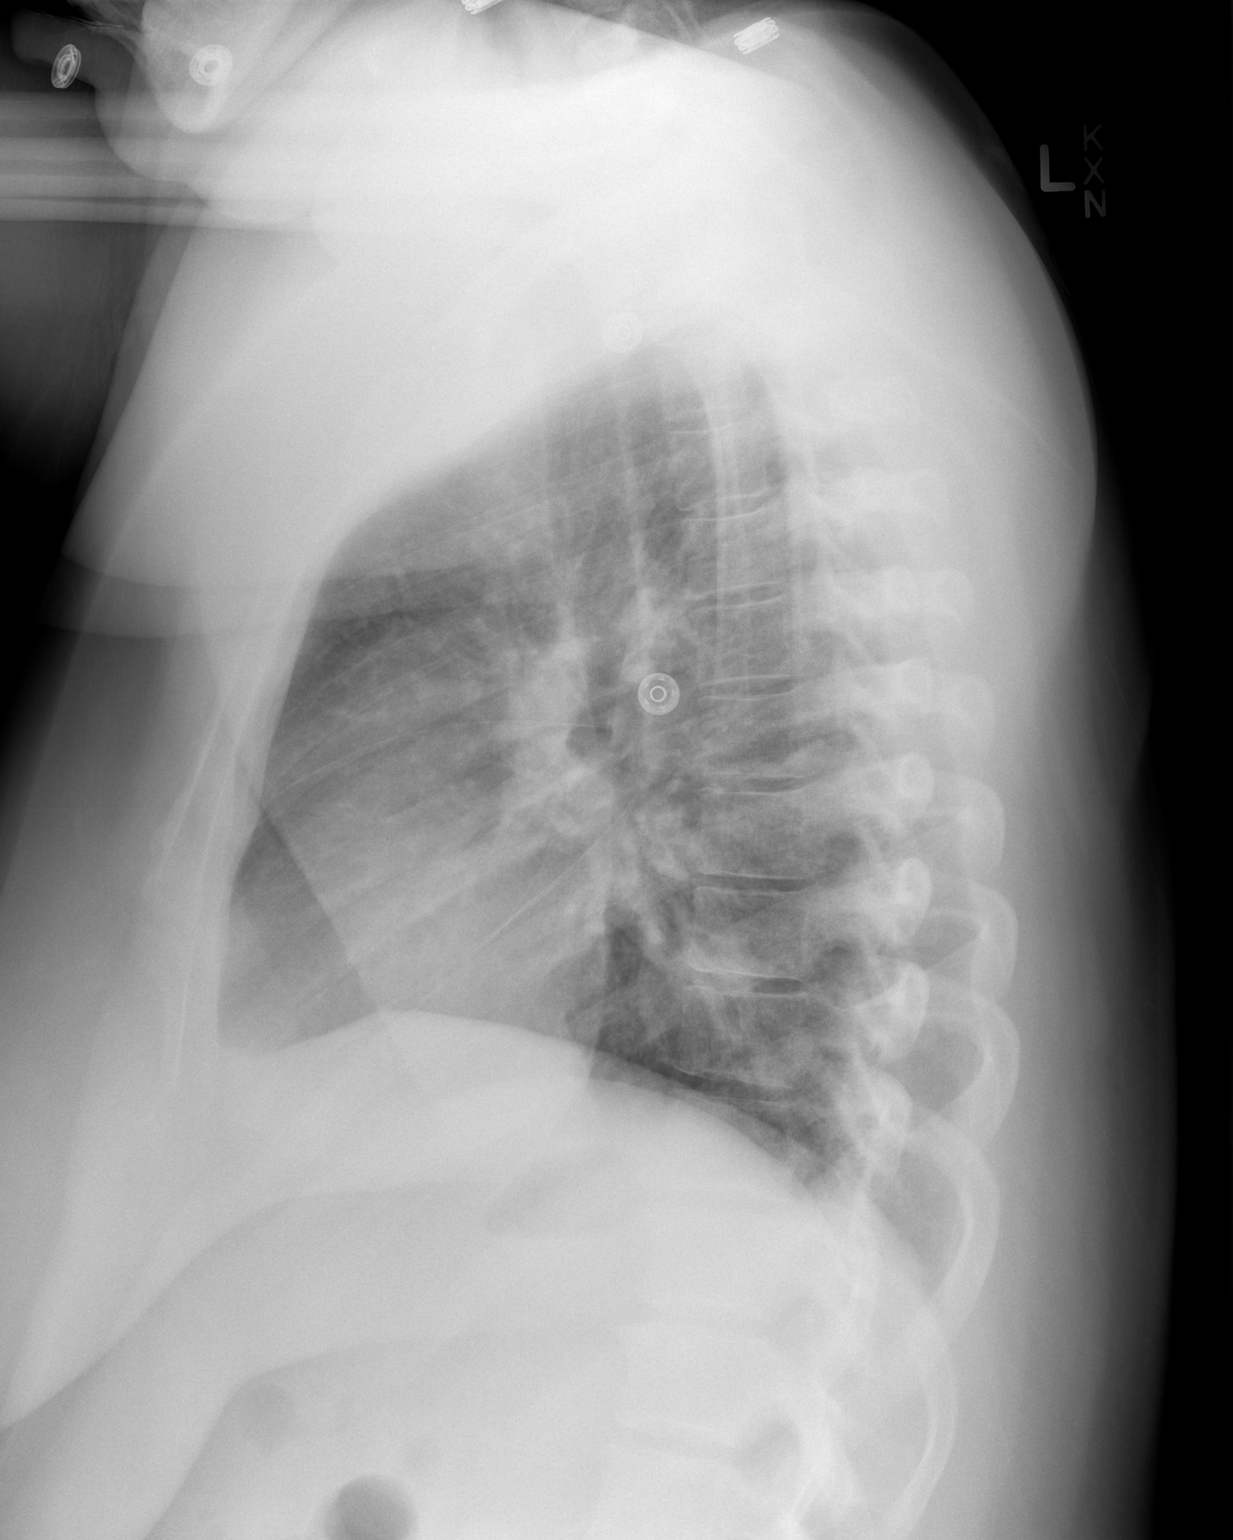

[2 of 2 positions shown; findings below may reference images not displayed]

FINDINGS: Lungs are clear. The heart size and pulmonary vascularity are
normal. No adenopathy. No bone lesions. No pneumothorax.
IMPRESSION: No abnormality noted.
